# Patient Record
Sex: Female | Born: 1980
Health system: Southern US, Community
[De-identification: ages and names within clinical notes are randomized; demographics above are authoritative.]

## PROBLEM LIST (undated history)

## (undated) DIAGNOSIS — L709 Acne, unspecified: Secondary | ICD-10-CM

## (undated) DIAGNOSIS — E079 Disorder of thyroid, unspecified: Secondary | ICD-10-CM

## (undated) DIAGNOSIS — I8390 Asymptomatic varicose veins of unspecified lower extremity: Secondary | ICD-10-CM

## (undated) HISTORY — DX: Acne, unspecified: L70.9

## (undated) HISTORY — DX: Asymptomatic varicose veins of unspecified lower extremity: I83.90

## (undated) HISTORY — PX: WISDOM TOOTH EXTRACTION: SHX21

## (undated) HISTORY — DX: Disorder of thyroid, unspecified: E07.9

---
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
## 2001-03-21 ENCOUNTER — Other Ambulatory Visit: Admission: RE | Admit: 2001-03-21 | Discharge: 2001-03-21 | Payer: Self-pay | Admitting: Obstetrics and Gynecology

## 2001-04-15 ENCOUNTER — Emergency Department (HOSPITAL_COMMUNITY): Admission: EM | Admit: 2001-04-15 | Discharge: 2001-04-15 | Payer: Self-pay | Admitting: Emergency Medicine

## 2001-04-20 ENCOUNTER — Encounter: Payer: Self-pay | Admitting: Emergency Medicine

## 2001-04-20 ENCOUNTER — Observation Stay (HOSPITAL_COMMUNITY): Admission: EM | Admit: 2001-04-20 | Discharge: 2001-04-21 | Payer: Self-pay | Admitting: Emergency Medicine

## 2002-08-22 ENCOUNTER — Ambulatory Visit (HOSPITAL_BASED_OUTPATIENT_CLINIC_OR_DEPARTMENT_OTHER): Admission: RE | Admit: 2002-08-22 | Discharge: 2002-08-22 | Payer: Self-pay | Admitting: Plastic Surgery

## 2002-08-22 ENCOUNTER — Encounter (INDEPENDENT_AMBULATORY_CARE_PROVIDER_SITE_OTHER): Payer: Self-pay | Admitting: Plastic Surgery

## 2003-05-08 ENCOUNTER — Other Ambulatory Visit: Admission: RE | Admit: 2003-05-08 | Discharge: 2003-05-08 | Payer: Self-pay | Admitting: Obstetrics and Gynecology

## 2003-07-28 ENCOUNTER — Encounter: Admission: RE | Admit: 2003-07-28 | Discharge: 2003-07-28 | Payer: Self-pay | Admitting: Obstetrics and Gynecology

## 2004-03-30 ENCOUNTER — Other Ambulatory Visit: Admission: RE | Admit: 2004-03-30 | Discharge: 2004-03-30 | Payer: Self-pay | Admitting: Obstetrics and Gynecology

## 2005-09-09 ENCOUNTER — Other Ambulatory Visit: Admission: RE | Admit: 2005-09-09 | Discharge: 2005-09-09 | Payer: Self-pay | Admitting: Obstetrics and Gynecology

## 2007-10-05 ENCOUNTER — Other Ambulatory Visit: Admission: RE | Admit: 2007-10-05 | Discharge: 2007-10-05 | Payer: Self-pay | Admitting: Obstetrics and Gynecology

## 2009-01-16 ENCOUNTER — Encounter: Payer: Self-pay | Admitting: Obstetrics and Gynecology

## 2009-01-16 ENCOUNTER — Other Ambulatory Visit: Admission: RE | Admit: 2009-01-16 | Discharge: 2009-01-16 | Payer: Self-pay | Admitting: Obstetrics and Gynecology

## 2009-01-16 ENCOUNTER — Ambulatory Visit: Payer: Self-pay | Admitting: Obstetrics and Gynecology

## 2009-06-08 ENCOUNTER — Ambulatory Visit: Payer: Self-pay | Admitting: Obstetrics and Gynecology

## 2009-10-16 ENCOUNTER — Ambulatory Visit: Payer: Self-pay | Admitting: Obstetrics and Gynecology

## 2010-01-01 ENCOUNTER — Other Ambulatory Visit: Admission: RE | Admit: 2010-01-01 | Discharge: 2010-01-01 | Payer: Self-pay | Admitting: Obstetrics and Gynecology

## 2010-01-01 ENCOUNTER — Ambulatory Visit: Payer: Self-pay | Admitting: Obstetrics and Gynecology

## 2010-09-24 NOTE — Op Note (Signed)
NAME:  SCHUYLER, BEHAN A                        ACCOUNT NO.:  [id number]   MEDICAL RECORD NO.:  [id number]                   PATIENT TYPE:  AMB   LOCATION:  DSC                                  FACILITY:  [facility]   PHYSICIAN:  Alfredia Ferguson, M.D.               DATE OF BIRTH:  1980/10/21   DATE OF PROCEDURE:  08/22/2002  DATE OF DISCHARGE:                                 OPERATIVE REPORT   PREOPERATIVE DIAGNOSES:  1. Biopsy proven dysplastic nevus, left breast just above areola, 8 mm,     previously excised with close margins.  2. Biopsy proven dysplastic nevus, left medial breast, 6 mm, previously     excised with close margins.  3. Cyst left preauricular area, 5 mm.   POSTOPERATIVE DIAGNOSES:  1. Biopsy proven dysplastic nevus, left breast just above areola, 8 mm,     previously excised with close margins.  2. Biopsy proven dysplastic nevus, left medial breast, 6 mm, previously     excised with close margins.  3. Cyst left preauricular area, 5 mm.   OPERATION:  1. Excision of dysplastic nevi x 2 left breast.  2. Excision of cyst left preauricular area.   SURGEON:  Alfredia Ferguson, M.D.   ANESTHESIA:  2% Xylocaine with 1:100,000 epinephrine.   INDICATIONS FOR PROCEDURE:  This is a 30 year old woman who had two dark  pigmented nevi on her left breast.  They were biopsied by Dr. Danella Deis and  returned dysplastic nevi with severe atypia.  The recommendation is wider  excision of these lesions.  The plan is to excise these with approximately 3  mm margins.  The patient understands she will be trading what she has for  permanent and potentially unsightly scar.  Inspite of that, she wishes to  proceed with the surgery.  In addition, the patient has a slowly enlarging  cyst in front of the left ear which has got a bluish discoloration to it.  She wishes to have that removed.   DESCRIPTION OF PROCEDURE:  Skin markers were placed in elliptical fashion  around the two lesions on the  left breast and also the lesion in the left  preauricular area.  Local anesthesia was infiltrated using 2% Xylocaine with  1:100,000 epinephrine.  The left breast was prepped with Betadine and draped  in a sterile fashion.  After waiting approximately ten minutes, an  elliptical excision of the lesion just above the left areola was carried out  down to the level of the subcutaneous tissue.  3 mm margins were used around  the lesion.  The lesion was excised.  Hemostasis was accomplished using  pressure.  The wound edges were undermined for a distance of several  millimeters in all directions.  The wound was closed by approximating the  dermis using multiple interrupted 5-0 Monocryl suture.  The skin edges were  united using a running 5-0 Monocryl subcuticular.  The left medial breast  dysplastic nevus was removed in an identical fashion and also closed in an  identical fashion.  Steri-Strips were applied to the skin edges after  cleansing the skin.  The left preauricular lesion was excised in an  elliptical fashion.  A very calcified appearing cyst was removed.  Specimen  was passed off for pathology.  Wound edges were undermined for a distance of  several millimeters in all directions.  The wound was closed by  approximating the dermis using interrupted 5-0 Monocryl suture.  The skin  was united using an interrupted 6-0 Nylon suture.  The patient tolerated the  procedure well.  Light dressings were applied and the patient was discharged  to home in satisfactory condition.                                                Alfredia Ferguson, M.D.    WBB/MEDQ  D:  08/22/2002  T:  08/22/2002  Job:  [id number]   cc:   Hope M. Danella Deis, M.D.  [address]  [place]  [place] [address]  Fax: [phone]

## 2010-09-24 NOTE — H&P (Signed)
[facility]  Patient:    Traci Best, Traci Best Visit Number: [id number] MRN: [id number]          Service Type: MED Location: 3W 0372 01 Attending Physician:  Doug Sou Dictated by:   Titus Dubin. Alwyn Ren, M.D. [facility] Admit Date:  04/20/2001                           History and Physical  REASON FOR ADMISSION:  Observation status.  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  She was also admitted because of intractable sore throat in the setting of mononucleosis pharyngitis and profound anorexia.  According to the patient and her mother, she essentially has had no solid food intake for a week and has been only able to ingest Lipton soup.  Her symptoms began while at college in late November and early December.  [facility] and found to have a positive monospot.  She came home from college because of the mononucleosis and because of the weather conditions with limited power and heat to her apartment.  She was seen in the emergency room fast track for the intractable sore throat on December 8.  At that time, she was placed on cephalexin 500 mg 4 times a day and prednisolone 15 mg 3 times a day.  She returned December 10 stating that she still was not able to swallow any solid food, and the sore throat was still severe.  She was only able to eat soft food.  She denied any abdominal pain, fever, chills, or sweats.  There had been no change in the caliber of urine or stool.  It was recommended she have a fasting liver panel and CBC with differential.  This was not done until the morning of December 13.  When blood draw was attempted, she became hypotensive and had near syncope.  There is a past history of some vasovagal phenomenon with blood draws, but, because of her questionable nutritional status and  intractability of symptoms, admission was arranged.  When seen on December 10, the prednisolone had been decreased to 15 mg twice a day, and she had been placed on Ultracet as needed and Magic Mouthwash.  PAST MEDICAL HISTORY:  No surgeries or hospitalizations.  She has been seen by a psychiatrist and has been on SNSSRI.  ALLERGIES:  None.  HABITS:  She does not smoke.  REVIEW OF SYSTEMS:  She has had no purulent secretions for her head or chest.  FAMILY HISTORY:  Noncontributory.  Her brother recently had mononucleosis hepatitis but has been going to school out of state in the [place].  PHYSICAL EXAMINATION:  GENERAL:  At this time, she is profoundly weak.  She also appears somewhat chronically ill.  VITAL SIGNS:  She was afebrile.  Pulse was 70 and regular.  Regular rate and rhythm was 18 and 22.  Blood pressure was 94/60.  HEENT:  Pupils are equal, round and reactive to light.  There was no scleral icterus.  The oral mucosa was moist, but the tongue was coated.  The tonsils were minimally enlarged but had a dense greenish-grey exudate.  NECK:  She had no lymphadenopathy of head, neck, or axilla.  CHEST:  Clear.  CARDIOVASCULAR:  She exhibited an S4 without significant murmur.  ABDOMEN:  Decreased bowel sounds with some dullness  in the right upper quadrant but no definite organomegaly.  SKIN:  Warm and dry without tenting.  NEUROLOGIC:   There were no localized neurologic signs.  EXTREMITIES:  Pedal pulses were present.  She had no edema.  PLAN:  She was admitted for IV fluids.  While hospitalized, she will be seen by otolaryngology.  ADDENDUM:  Admission white count was 15,600 with 52 lymphs and 40 neutrophils. This would be compatible with the mononucleosis and the steroid therapy. Hematocrit was 40.6.  She did exhibit ketones in her urine.  SGPT was 138 with a normal of less than 40.  Potassium was 3.  Supplemental potassium will be provided  parenterally. Dictated by:   Titus Dubin. Alwyn Ren, M.D. [facility] Attending Physician:  Doug Sou DD:  04/20/01 TD:  04/20/01 Job: [id number]

## 2010-10-11 ENCOUNTER — Ambulatory Visit (INDEPENDENT_AMBULATORY_CARE_PROVIDER_SITE_OTHER): Payer: PRIVATE HEALTH INSURANCE | Admitting: Obstetrics and Gynecology

## 2010-10-11 DIAGNOSIS — N912 Amenorrhea, unspecified: Secondary | ICD-10-CM

## 2010-11-17 ENCOUNTER — Other Ambulatory Visit (INDEPENDENT_AMBULATORY_CARE_PROVIDER_SITE_OTHER): Payer: PRIVATE HEALTH INSURANCE

## 2010-11-17 DIAGNOSIS — N912 Amenorrhea, unspecified: Secondary | ICD-10-CM

## 2011-01-05 ENCOUNTER — Other Ambulatory Visit: Payer: Self-pay | Admitting: Obstetrics and Gynecology

## 2011-01-05 DIAGNOSIS — L709 Acne, unspecified: Secondary | ICD-10-CM

## 2011-01-14 ENCOUNTER — Encounter: Payer: Self-pay | Admitting: Obstetrics and Gynecology

## 2011-01-14 ENCOUNTER — Ambulatory Visit (INDEPENDENT_AMBULATORY_CARE_PROVIDER_SITE_OTHER): Payer: BC Managed Care – PPO | Admitting: Obstetrics and Gynecology

## 2011-01-14 ENCOUNTER — Other Ambulatory Visit (HOSPITAL_COMMUNITY)
Admission: RE | Admit: 2011-01-14 | Discharge: 2011-01-14 | Disposition: A | Discharge status: Home / Self Care | Payer: BC Managed Care – PPO | Source: Ambulatory Visit | Attending: Obstetrics and Gynecology | Admitting: Obstetrics and Gynecology

## 2011-01-14 VITALS — BP 116/70 | Ht 68.0 in | Wt 118.0 lb

## 2011-01-14 DIAGNOSIS — Z01419 Encounter for gynecological examination (general) (routine) without abnormal findings: Secondary | ICD-10-CM

## 2011-01-14 DIAGNOSIS — L709 Acne, unspecified: Secondary | ICD-10-CM | POA: Insufficient documentation

## 2011-01-14 NOTE — Progress Notes (Signed)
Patient came to see me today for her annual GYN exam. She is very frustrated. She can't extend the time without her period see above notes. We tried withdrawal her with Provera in July. When she did not respond we checked her prolactin FSH and TSH all of which were normal. She then had a spontaneous period in August. She is not sexually active. She is having a lot of trouble with acne which is really her biggest issue. She is also having a lot of PMS. She was previously on yaz but stopped about a year ago. She was not anxious to take birth control pills in one of her body to work on its own. She is going to see her dermatologist next week. There certainly are some family history with her parents divorce. She's also lost some more weight  although some of it was just recently.  Physical examination: HEENT within normal limits. Neck: Thyroid not large. No masses. Supraclavicular nodes: not enlarged. Breasts: Examined in both sitting midline position. No skin changes and no masses. Abdomen: Soft no guarding rebound or masses or hernia. Pelvic: External: Within normal limits. BUS: Within normal limits. Vaginal:within normal limits. Good estrogen effect. No evidence of cystocele rectocele or enterocele. Cervix: clean. Uterus: Normal size and shape. Adnexa: No masses. Rectovaginal exam: Confirmatory and negative. Extremities: Within normal limits.  Assessment: Secondary amenorrhea of hypothalamic pituitary type. PMS. Acne.  Plan: We started today on Loestrin 24 which I think will help all the above. I told her I did not think there was anyway to induce her ovarys to produce more estrogen other than fertility drugs which are not indicated. Offered to refer to reproductive endocrinologist which we wait on.she will discuss her acne with her dermatologist next week. She actually reduce her spironolactone  to from 100 mg to 50 mg because she thought her skin was getting worse on 100 mg.

## 2011-02-01 ENCOUNTER — Encounter: Payer: Self-pay | Admitting: *Deleted

## 2011-02-17 ENCOUNTER — Encounter: Payer: PRIVATE HEALTH INSURANCE | Admitting: Obstetrics and Gynecology

## 2011-02-21 ENCOUNTER — Telehealth: Payer: Self-pay | Admitting: *Deleted

## 2011-02-21 MED ORDER — SPIRONOLACTONE 50 MG PO TABS: 50.0000 mg | ORAL_TABLET | Freq: Every day | ORAL | Status: DC

## 2011-02-21 NOTE — Telephone Encounter (Signed)
yes

## 2011-02-21 NOTE — Telephone Encounter (Signed)
Pharmacy faxed a request to change Spironolactone 100mg  to 50mg .  They say that patient states she is only taking 1/2 of the 100mg  but wants to switch to just the 50mg .  Ok to change?

## 2011-02-21 NOTE — Telephone Encounter (Signed)
rx sent for below message.

## 2011-04-18 ENCOUNTER — Telehealth: Payer: Self-pay | Admitting: *Deleted

## 2011-04-18 DIAGNOSIS — N912 Amenorrhea, unspecified: Secondary | ICD-10-CM

## 2011-04-18 MED ORDER — NORETHIN ACE-ETH ESTRAD-FE 1-20 MG-MCG PO TABS: 1.0000 | ORAL_TABLET | Freq: Every day | ORAL | Status: DC

## 2011-04-18 NOTE — Telephone Encounter (Signed)
Patient called to say still having issues with not having a period.  Dr. Eda Paschal gave the patient an rx for Loestrin 24FE at last visit but never filled it. Patient is at whits end with skin breakouts and no period.  Wants to go ahead with oc's rx can we call in?

## 2011-04-18 NOTE — Telephone Encounter (Signed)
Telephone call to review requests. States no cycle after Provera withdraw. Has not been sexually active in greater than a year. Was seen in the office in September did not start on birth control pills as recommended by Dr. Eda Paschal and would like to start now. Prescription is not available at pharmacy, will call in Loestrin 1/20. Will call if no cycle, condoms encouraged if active.

## 2011-04-29 ENCOUNTER — Telehealth: Payer: Self-pay | Admitting: *Deleted

## 2011-04-29 NOTE — Telephone Encounter (Signed)
Pt called wanting she lab results in June 2012 faxed to her. Lab results faxed to pt as requested.

## 2011-05-13 ENCOUNTER — Encounter: Payer: Self-pay | Admitting: Internal Medicine

## 2011-05-13 ENCOUNTER — Telehealth: Payer: Self-pay | Admitting: *Deleted

## 2011-05-13 ENCOUNTER — Ambulatory Visit (INDEPENDENT_AMBULATORY_CARE_PROVIDER_SITE_OTHER): Payer: BC Managed Care – PPO | Admitting: Internal Medicine

## 2011-05-13 DIAGNOSIS — R5383 Other fatigue: Secondary | ICD-10-CM

## 2011-05-13 DIAGNOSIS — R5381 Other malaise: Secondary | ICD-10-CM

## 2011-05-13 DIAGNOSIS — Z0000 Encounter for general adult medical examination without abnormal findings: Secondary | ICD-10-CM

## 2011-05-13 DIAGNOSIS — R51 Headache: Secondary | ICD-10-CM

## 2011-05-13 DIAGNOSIS — R000 Tachycardia, unspecified: Secondary | ICD-10-CM

## 2011-05-13 MED ORDER — DESOGESTREL-ETHINYL ESTRADIOL 0.15-30 MG-MCG PO TABS: 1.0000 | ORAL_TABLET | Freq: Every day | ORAL | Status: DC

## 2011-05-13 MED ORDER — TRAMADOL HCL 50 MG PO TABS: 50.0000 mg | ORAL_TABLET | Freq: Four times a day (QID) | ORAL | Status: AC | PRN

## 2011-05-13 NOTE — Telephone Encounter (Signed)
Spoke with pt about the below and she would like the brand desogen. rx sent to pharmacy

## 2011-05-13 NOTE — Telephone Encounter (Signed)
Try generic Desogen

## 2011-05-13 NOTE — Telephone Encounter (Signed)
Pt calling stating she doesn't like her birth control generic Junel 1/20 she has had mood swings, along with skin problems. She would like to switch to another low dose pill. Pt had annual on 01/14/11. Please advise

## 2011-05-13 NOTE — Progress Notes (Signed)
Subjective:    Patient ID: Traci Best, female    DOB: February 14, 1981, 30 y.o.   MRN: [id number]  HPI #1 FATIGUE Onset: 10/2009 Fatigue @ rest : yes    Primarily motivational fatigue:no  Primarily physical fatigue: yes Symptoms: Fever/ chills : no  Night sweats: no                                                                                            Vision changes ( blurred/ double/ loss): ? Blurring from computer use                                                                                                 Hoarseness or swallowing dysfunction: no                                                                                         Bowel changes( constipation/ diarrhea):variable loose or constipated                                                                                Weight change:   Up 6 # with BCP Exertional chest pain: no  Dyspnea on exertion: no  Cough: no  Hemoptysis: no   Leg swelling: no  Orthopnea: no  PND: no  Melena/ rectal bleeding: no  Adenopathy: no  Severe snoring: no  Daytime sleepiness: yes  Skin / hair / nail changes: yes, hair loss; skin rash on neck since late 03/2011; acne prior to BCP Temperature intolerance( heat/ cold) : cold intlerance                                                                                Feeling depressed: no  Anhedonia: no  Altered appetite: no  Poor sleep/  Apnea : no  Abnormal bruising / bleeding or enlarged lymph nodes: easy bruising                                                       PMH/ FH of thyroid disease:no    #2 HEADACHE : Onset:past 12 months  Location: variable  Quality: dull Frequency: 4-5 X/ week Precipitating factors:no triggers Prodrome/ Aura: no Prior treatment:BCpowder , NSAIDS  Associated Symptoms Nausea/vomiting: no  Photophobia/phonophobia: to light  Tearing of eyes: no  Sinus pain/pressure: no  Family hx/PMH of migraine: no  Relation to menstrual cycle: yes, worse with  menses . Menses have been irregular; she did not have a period for 7 months. It is one of the reasons the birth control pills were prescribed.  Red Flags Fever: no  Neck pain/stiffness: no  Vision/speech/swallow/hearing difficulty: no  Focal weakness/numbness: no  Altered mental status: yes, "very foggy"  New type of headache: no , but more chronic  Lab studies 04/11/11 were reviewed. CBC and differential, sedimentation rate, glucose, and chemistries were normal. Ferritin and testosterone were also normal. TSH, FSH, and prolactin were normal in July 2012.  DHEA was 35 with normals greater than 40.      Review of Systems     Objective:   Physical Exam  Gen. appearance: thin but well-nourished, in no distress. Head: Normocephalic. Hair is very fine. No significant scalp lesions Eyes: Extraocular motion intact, field of vision normal, vision grossly intact, no nystagmus ENT: Canals clear, tympanic membranes normal, tuning fork exam normal, hearing grossly normal Neck: Normal range of motion, no masses, normal thyroid Cardiovascular: Rate and rhythm normal; no murmurs, gallops . Mitral click suggestive apex Muscle skeletal: Range of motion, tone, &  strength normal Neuro:no cranial nerve deficit, deep tendon  reflexes normal, gait normal. Romberg and finger to nose testing is normal Lymph: No cervical or axillary LA Skin: Warm and dry without suspicious lesions. There are slightly rough erythematous patches over the  upper lateral neck bilaterally. There is a small subcutaneous granuloma at the angle of the left mandible Psych: no anxiety or mood change. Normally interactive and cooperative.         Assessment & Plan:  #1 protracted fatigue  #2 hair loss cervical evaluation revealed low DHEA level  #3 headaches, variable  #4 subcutaneous, versus epidermoid inclusion cyst. If this changes in character ( color, size, or tenderness ) it should be addressed.  #5 resting  tachycardia. EKG is normal with no ischemic changes  #6 history suggest possible irritable bowel syndrome. Probiotic trial would be recommended for loose stools.  Plan: Thyroid function test should be repeated as the results are almost 6 months old. Because of the DHEA additional endocrinologic studies may be indicated. Headache diary will be monitored. She'll be given tramadol to take for the headaches.

## 2011-05-13 NOTE — Patient Instructions (Addendum)
Please keep a diary of your headaches . Document  each occurrence on the calendar with notation of : #1 any prodrome ( any non headache symptom such as marked fatigue,visual changes, ,etc ) which precedes actual headache ; #2) severity on 1-10 scale; #3) any triggers ( food/ drink,enviromenntal or weather changes ,physical or emotional stress) in 8-12 hour period prior to the headache; & #4) response to any medications or other intervention. Please review "Headache" @ [facility] for additional information.    Please take the probiotic , Align, every day until the bowels are normal. This will replace the normal bacteria which  are necessary for formation of normal stool and processing of food.  Use Eucerin or another moisturizing agent  twice a day  for the drying. If there is itching; the moisturizing agent can be mixed one part to one part of Cort Aid  and applied twice a day as needed.

## 2011-05-19 ENCOUNTER — Telehealth: Payer: Self-pay

## 2011-05-19 DIAGNOSIS — R5381 Other malaise: Secondary | ICD-10-CM

## 2011-05-19 NOTE — Telephone Encounter (Signed)
Future orders placed.  Dr.Hopper please advise if additional test can be added.

## 2011-05-19 NOTE — Telephone Encounter (Signed)
I e-mailed her that I did not recommend a CA 125. This test is very poor screening test and too often results in unnecessary surgery with associated risks. If her gynecologist feels it  is indicated, he can order it.

## 2011-05-20 ENCOUNTER — Other Ambulatory Visit (INDEPENDENT_AMBULATORY_CARE_PROVIDER_SITE_OTHER): Payer: BC Managed Care – PPO

## 2011-05-20 DIAGNOSIS — L659 Nonscarring hair loss, unspecified: Secondary | ICD-10-CM

## 2011-05-20 DIAGNOSIS — R5381 Other malaise: Secondary | ICD-10-CM

## 2011-05-20 DIAGNOSIS — R5383 Other fatigue: Secondary | ICD-10-CM

## 2011-05-20 LAB — T4, FREE: Free T4: 0.8 ng/dL (ref 0.60–1.60)

## 2011-05-20 LAB — TSH: TSH: 2.88 u[IU]/mL (ref 0.35–5.50)

## 2011-05-26 ENCOUNTER — Telehealth: Payer: Self-pay

## 2011-05-26 NOTE — Telephone Encounter (Signed)
Patient emailed Dr.Hopper about lab results  I called patient and informed her labs mailed and gave Dr.Hopper's response: All labs are excellent.As I discussed with your mother; options are to see me to discuss a trial of medicine for chronic fatigue or consultation wirh fibromyalgia specialist to rule out that condition. Please inform me as to your preference.Hopp  Patient stated she will wait until she receives mailed copy and then decide if she will f/u here or see specialist

## 2011-06-02 ENCOUNTER — Institutional Professional Consult (permissible substitution): Payer: BC Managed Care – PPO | Admitting: Obstetrics and Gynecology

## 2011-06-17 ENCOUNTER — Other Ambulatory Visit: Payer: Self-pay | Admitting: Internal Medicine

## 2011-06-17 DIAGNOSIS — R768 Other specified abnormal immunological findings in serum: Secondary | ICD-10-CM

## 2011-07-08 DIAGNOSIS — E063 Autoimmune thyroiditis: Secondary | ICD-10-CM | POA: Insufficient documentation

## 2011-09-05 ENCOUNTER — Ambulatory Visit (INDEPENDENT_AMBULATORY_CARE_PROVIDER_SITE_OTHER): Payer: BC Managed Care – PPO | Admitting: Obstetrics and Gynecology

## 2011-09-05 DIAGNOSIS — N63 Unspecified lump in unspecified breast: Secondary | ICD-10-CM

## 2011-09-05 NOTE — Progress Notes (Signed)
Patient came to see me today with a short history of feeling a lump in her right breast. It is associated with a white area on her nipple. She took birth control pills for several months and was not happy on them. She stopped them and she is now having spontaneous cycles. Her PMS is much better.  Exam: Traci Best present. Both breasts were carefully examined the sitting and lying position. In her right breast at 9:00 she has significant lumpiness  without a dominant lesion. There is a whitehead on 1 of her nipple ducts consistent with a montgomery gland. Her left breast exam is completely normal.  Assessment: Possible breast lesion of right breast.  Plan: Diagnostic mammogram and ultrasound.

## 2011-09-06 ENCOUNTER — Other Ambulatory Visit: Payer: Self-pay | Admitting: *Deleted

## 2011-09-06 ENCOUNTER — Telehealth: Payer: Self-pay | Admitting: *Deleted

## 2011-09-06 DIAGNOSIS — N63 Unspecified lump in unspecified breast: Secondary | ICD-10-CM

## 2011-09-06 NOTE — Telephone Encounter (Signed)
Message copied by Mckinley Jewel, Salvatore Shear L on Tue Sep 06, 2011 11:17 AM ------      Message from: Trellis Paganini      Created: Mon Sep 05, 2011  1:11 PM       Schedule patient for next Thursday or Friday afternoon( not this week) at the breast center for ultrasound, possible diagnostic mammogram of right breast due to lump in right breast at 9:00 associated with small nipple lesion.

## 2011-09-06 NOTE — Telephone Encounter (Signed)
Lm for patient to call.  appt set up at the [facility] on 09/17/11 @ 2pm.

## 2011-09-07 NOTE — Telephone Encounter (Signed)
Patient informed. 

## 2011-09-15 ENCOUNTER — Ambulatory Visit
Admission: RE | Admit: 2011-09-15 | Discharge: 2011-09-15 | Disposition: A | Discharge status: Home / Self Care | Payer: BC Managed Care – PPO | Source: Ambulatory Visit | Attending: Obstetrics and Gynecology | Admitting: Obstetrics and Gynecology

## 2011-09-15 DIAGNOSIS — N63 Unspecified lump in unspecified breast: Secondary | ICD-10-CM

## 2011-12-26 ENCOUNTER — Telehealth: Payer: Self-pay | Admitting: *Deleted

## 2011-12-26 NOTE — Telephone Encounter (Signed)
Okay to refer Dr. Leslie Dales for hashimoto's disease.

## 2011-12-26 NOTE — Telephone Encounter (Signed)
(  Pt aware you are out of the office) pt called requesting referral for endocrinologist Dr. Casimiro Needle [place]. I asked pt what MD  is following her Thyroid and was told that Dr.Hopper, I asked pt why isn't Dr.Hopper referring  pt to endocrinologist and was told she would prefer you to do this. Dx: hashimoto's disease. Please advise

## 2011-12-27 NOTE — Telephone Encounter (Signed)
Pt records faxed to Dr. Leslie Dales office [phone] (office) [phone]. They will contact pt and schedule appointment and contact us with time and date.

## 2012-01-02 NOTE — Telephone Encounter (Signed)
Appt. 01/17/12 @ 2:30 pm with MD.

## 2012-01-18 ENCOUNTER — Encounter: Payer: Self-pay | Admitting: Obstetrics and Gynecology

## 2012-01-18 ENCOUNTER — Ambulatory Visit (INDEPENDENT_AMBULATORY_CARE_PROVIDER_SITE_OTHER): Payer: BC Managed Care – PPO | Admitting: Obstetrics and Gynecology

## 2012-01-18 VITALS — BP 110/70 | Ht 68.25 in | Wt 122.0 lb

## 2012-01-18 DIAGNOSIS — Z01419 Encounter for gynecological examination (general) (routine) without abnormal findings: Secondary | ICD-10-CM

## 2012-01-18 DIAGNOSIS — E079 Disorder of thyroid, unspecified: Secondary | ICD-10-CM | POA: Insufficient documentation

## 2012-01-18 NOTE — Progress Notes (Signed)
Patient came to see me today for her annual GYN exam. Her menstrual cycles are regular. She had a normal Pap smear last year. She's never had abnormal Pap smears.she has not been sexually active since I last saw her. She has been bothered by fatigue, hair loss, acne and rashes as well as some depression and has been diagnosed with Hashimoto's disease with elevated thyroid antibodies. She just started T4 for treatment. We have been giving her spironolactone for acne. She is only taking 50 mg daily and knows she can increase it slowly up to 200 but wants to stay at 50 until she sees how she is doing with the T4. She is having no abnormal bleeding. She is having no pelvic pain.  Physical examination: Kennon Portela present. HEENT within normal limits. Neck: Thyroid not large. No masses. Supraclavicular nodes: not enlarged. Breasts: Examined in both sitting and lying  position. No skin changes and no masses. Abdomen: Soft no guarding rebound or masses or hernia. Pelvic: External: Within normal limits. BUS: Within normal limits. Vaginal:within normal limits. Good estrogen effect. No evidence of cystocele rectocele or enterocele. Cervix: clean. Uterus: Normal size and shape. Adnexa: No masses. Rectovaginal exam: Confirmatory and negative. Extremities: Within normal limits.  Assessment: Normal GYN exam. Hashimoto's disease.  Plan: Continue spironolactone 50 mg daily.The new Pap smear guidelines were discussed with the patient. No Pap Done.

## 2012-01-18 NOTE — Patient Instructions (Signed)
Call us with the name of the T4 product you are taking.

## 2012-01-19 LAB — URINALYSIS W MICROSCOPIC + REFLEX CULTURE
Bacteria, UA: NONE SEEN
Casts: NONE SEEN
Glucose, UA: NEGATIVE mg/dL
Hgb urine dipstick: NEGATIVE
Ketones, ur: NEGATIVE mg/dL
Leukocytes, UA: NEGATIVE
Protein, ur: NEGATIVE mg/dL
pH: 7.5 (ref 5.0–8.0)

## 2012-02-21 ENCOUNTER — Other Ambulatory Visit: Payer: Self-pay | Admitting: Obstetrics and Gynecology

## 2012-10-11 ENCOUNTER — Telehealth: Payer: Self-pay | Admitting: Internal Medicine

## 2012-10-11 NOTE — Telephone Encounter (Signed)
Because of her unique situation we want to avoid immunizations that are not totally necessary. Dr. Alto Denver is the expert in travel medicine and would optimize treatment and prophylaxis for the [place] trip  I'll be traveling to [place] in August and will be seeing Dr.Hunt  as well

## 2012-10-11 NOTE — Telephone Encounter (Signed)
Discuss with patient and gave contact info for travel clinic

## 2012-10-11 NOTE — Telephone Encounter (Signed)
Patient states that she is going to [place] and would like to know if she needs any vaccinations? I told her earlier to call the Health Department, but someone there directed her back to [place].

## 2012-10-11 NOTE — Telephone Encounter (Signed)
Pt indicated that she look up info on CDC and it is recommended for her travel to [place] that she get Typhoid, and  Hep A. Pt would like for Hopp to review her current medical history and give his recommendation as well if Pt should get these vaccine. Pt would also like to know if Alfonse Flavors is willing to Rx med for altitude when she travels. Last OV 05-13-11.Please advise

## 2012-12-10 ENCOUNTER — Ambulatory Visit (INDEPENDENT_AMBULATORY_CARE_PROVIDER_SITE_OTHER): Payer: BC Managed Care – PPO | Admitting: Internal Medicine

## 2012-12-10 ENCOUNTER — Encounter: Payer: Self-pay | Admitting: Internal Medicine

## 2012-12-10 VITALS — BP 100/66 | HR 62 | Temp 98.7°F | Wt 129.0 lb

## 2012-12-10 DIAGNOSIS — K589 Irritable bowel syndrome without diarrhea: Secondary | ICD-10-CM

## 2012-12-10 MED ORDER — HYOSCYAMINE SULFATE 0.125 MG SL SUBL: SUBLINGUAL_TABLET | SUBLINGUAL | Status: DC

## 2012-12-10 NOTE — Progress Notes (Signed)
  Subjective:    Patient ID: Traci Best, female    DOB: 01-15-1981, 32 y.o.   MRN: [id number]  HPI  On an air flight back from [place] 7/17 she began to have her present GI symptoms. She was improved 7/11-7/17/14. Clear liquids did help.  The symptoms are described as hyperactivity with increased bowel sounds and being "gassy and irregular". The stools are described as intermittently loose.  She questions possible relationship to ingesting nuts and and food containing seeds. She also questions a possible role of job related stresses.  Her father has a history of diverticulosis.    Review of Systems  She denies fever, chills, sweats, abdominal pain, or unexplained weight loss. She is not having frank diarrhea.  She denies Coke-colored urine or clay colored stools. She's had no melena or rectal bleeding.     Objective:   Physical Exam General appearance : thin but well nourished; w/o distress.  Eyes: No conjunctival inflammation or scleral icterus is present.  Oral exam: Dental hygiene is good; lips and gums are healthy appearing.There is no oropharyngeal erythema or exudate noted.   Heart:  Normal rate and regular rhythm. S1 and S2 normal without gallop, murmur, click, rub or other extra sounds     Lungs:Chest clear to auscultation; no wheezes, rhonchi,rales ,or rubs present.No increased work of breathing.   Abdomen: bowel sounds hyperactive, soft and non-tender without masses, organomegaly or hernias noted.  No guarding or rebound   Skin:Warm & dry.  Intact without suspicious lesions or rashes ; no jaundice or tenting  Lymphatic: No lymphadenopathy is noted about the head, neck, or axilla.             Assessment & Plan:  #1IBS See orders

## 2012-12-10 NOTE — Patient Instructions (Addendum)
Please take the probiotic ,Florastor, every day until the bowels are normal. This will replace the normal bacteria which  are necessary for formation of normal stool and processing of food. Reflux of gastric acid may be asymptomatic as this may occur mainly during sleep.The triggers for reflux  include stress; the "aspirin family" ; alcohol; peppermint; and caffeine (coffee, tea, cola, and chocolate). The aspirin family would include aspirin and the nonsteroidal agents such as ibuprofen &  Naproxen. Tylenol would not cause reflux. If having symptoms ; food & drink should be avoided for @ least 2 hours before going to bed.  Zantac 75 mg 1-2 every 12 hours as needed.

## 2013-01-21 ENCOUNTER — Encounter: Payer: Self-pay | Admitting: Gynecology

## 2013-01-21 ENCOUNTER — Ambulatory Visit (INDEPENDENT_AMBULATORY_CARE_PROVIDER_SITE_OTHER): Payer: BC Managed Care – PPO | Admitting: Gynecology

## 2013-01-21 VITALS — BP 110/64 | Ht 68.0 in | Wt 126.0 lb

## 2013-01-21 DIAGNOSIS — Z01419 Encounter for gynecological examination (general) (routine) without abnormal findings: Secondary | ICD-10-CM

## 2013-01-21 DIAGNOSIS — Z13220 Encounter for screening for lipoid disorders: Secondary | ICD-10-CM

## 2013-01-21 LAB — CBC WITH DIFFERENTIAL/PLATELET
Basophils Absolute: 0 10*3/uL (ref 0.0–0.1)
Eosinophils Relative: 1 % (ref 0–5)
Lymphocytes Relative: 42 % (ref 12–46)
Lymphs Abs: 2.2 10*3/uL (ref 0.7–4.0)
MCV: 88.2 fL (ref 78.0–100.0)
Neutro Abs: 2.4 10*3/uL (ref 1.7–7.7)
Neutrophils Relative %: 47 % (ref 43–77)
Platelets: 277 10*3/uL (ref 150–400)
RBC: 4.67 MIL/uL (ref 3.87–5.11)
RDW: 13.5 % (ref 11.5–15.5)
WBC: 5.2 10*3/uL (ref 4.0–10.5)

## 2013-01-21 LAB — LIPID PANEL
Cholesterol: 175 mg/dL (ref 0–200)
Total CHOL/HDL Ratio: 2.5 Ratio

## 2013-01-21 LAB — COMPREHENSIVE METABOLIC PANEL
ALT: 21 U/L (ref 0–35)
AST: 16 U/L (ref 0–37)
CO2: 26 mEq/L (ref 19–32)
Calcium: 10.2 mg/dL (ref 8.4–10.5)
Chloride: 102 mEq/L (ref 96–112)
Creat: 0.77 mg/dL (ref 0.50–1.10)
Potassium: 4.4 mEq/L (ref 3.5–5.3)
Sodium: 135 mEq/L (ref 135–145)
Total Protein: 7.7 g/dL (ref 6.0–8.3)

## 2013-01-21 MED ORDER — SPIRONOLACTONE 50 MG PO TABS: ORAL_TABLET | ORAL | Status: DC

## 2013-01-21 NOTE — Progress Notes (Signed)
Traci Best 07-17-1980 [id number]        32 y.o.  G0P0 for annual exam.  Former patient of Dr. Eda Paschal. Doing well without complaints.  Past medical history,surgical history, medications, allergies, family history and social history were all reviewed and documented in the EPIC chart.  ROS:  Performed and pertinent positives and negatives are included in the history, assessment and plan .  Exam: Kim assistant Filed Vitals:   01/21/13 1027  BP: 110/64  Height: 5\' 8"  (1.727 m)  Weight: 126 lb (57.153 kg)   General appearance  Normal Skin grossly normal Head/Neck normal with no cervical or supraclavicular adenopathy thyroid normal Lungs  clear Cardiac RR, without RMG Abdominal  soft, nontender, without masses, organomegaly or hernia Breasts  examined lying and sitting without masses, retractions, discharge or axillary adenopathy. Pelvic  Ext/BUS/vagina  normal  Cervix  normal  Uterus  anteverted, normal size, shape and contour, midline and mobile nontender   Adnexa  Without masses or tenderness    Anus and perineum  normal      Assessment/Plan:  32 y.o. G0P0 female for annual exam, regular menses, not sexually active.   1. Contraception. Patient previously had been on oral contraceptives but stopped these because she did not like the way she felt. She is not sexual active and this is not an issue from a contraceptive standpoint. She preferred to stay off of these at present. Having regular monthly menses. We'll continue to monitor. 2. Complexion. Patient has been on spironolactone 50 mg for acne per Dr. Eda Paschal. She wants to stop this because she feels it was more thyroid dysfunction related and since having her thyroiditis treated she feels it has gotten better. She does want to continue it for the next 6 months while she is in [place] because she is afraid to stop it and I refilled her time 6 months. She will then stop after that and we'll go from there. We'll check  electrolytes today. 3. Breast health. SBE monthly reviewed. Did have a mammogram at age 45 which was normal. Discussed screening recommendations between 59 and 40. 4. Pap smear 2012 normal. No Pap smear done today. No history of abnormal Pap smears previously. Plan repeat next year 3 year interval. 5. Health maintenance. Baseline CBC comprehensive metabolic panel lipid profile urinalysis ordered. She will continue to followup with her endocrinologist in reference to her thyroid. Followup here in one year, sooner if any issues.   Note: This document was prepared with digital dictation and possible smart phrase technology. Any transcriptional errors that result from this process are unintentional.   [name] MD, 11:00 AM 01/21/2013

## 2013-01-21 NOTE — Patient Instructions (Signed)
Stop spironolactone when you return from [place] as we discussed. Call me if you have any issues with this. Followup in one year for annual exam and Pap smear.

## 2013-01-22 LAB — URINALYSIS W MICROSCOPIC + REFLEX CULTURE
Crystals: NONE SEEN
Glucose, UA: NEGATIVE mg/dL
Leukocytes, UA: NEGATIVE
Protein, ur: NEGATIVE mg/dL
Specific Gravity, Urine: 1.008 (ref 1.005–1.030)
Squamous Epithelial / LPF: NONE SEEN
Urobilinogen, UA: 0.2 mg/dL (ref 0.0–1.0)

## 2013-03-14 ENCOUNTER — Other Ambulatory Visit: Payer: Self-pay

## 2013-08-05 ENCOUNTER — Other Ambulatory Visit: Payer: Self-pay | Admitting: Gynecology

## 2013-08-16 ENCOUNTER — Telehealth: Payer: Self-pay | Admitting: *Deleted

## 2013-08-16 NOTE — Telephone Encounter (Signed)
(  pt aware you are out of the office) Pt Rx for spironolactone 50 mg for acne which was denied, I read the note 01/21/13 that said "Patient has been on spironolactone 50 mg for acne per Dr. Eda PaschalGottsegen. She wants to stop this because she feels it was more thyroid dysfunction related and since having her thyroiditis treated she feels it has gotten better. She does want to continue it for the next 6 months while [place] because she is afraid to stop it and I refilled her time 6 months. She will then stop after that and we'll go from there. " pt asked if you could refill until her annual which will not be scheduled until Oct[place]. Please advise

## 2013-08-19 MED ORDER — SPIRONOLACTONE 50 MG PO TABS: ORAL_TABLET | ORAL | Status: DC

## 2013-08-19 NOTE — Telephone Encounter (Signed)
Okay to refill through annual 

## 2013-08-19 NOTE — Telephone Encounter (Signed)
Pt informed, rx sent 

## 2014-01-30 ENCOUNTER — Other Ambulatory Visit (HOSPITAL_COMMUNITY)
Admission: RE | Admit: 2014-01-30 | Discharge: 2014-01-30 | Disposition: A | Discharge status: Home / Self Care | Payer: BC Managed Care – PPO | Source: Ambulatory Visit | Attending: Gynecology | Admitting: Gynecology

## 2014-01-30 ENCOUNTER — Encounter: Payer: Self-pay | Admitting: Gynecology

## 2014-01-30 ENCOUNTER — Ambulatory Visit (INDEPENDENT_AMBULATORY_CARE_PROVIDER_SITE_OTHER): Payer: BC Managed Care – PPO | Admitting: Gynecology

## 2014-01-30 VITALS — BP 112/64 | Ht 69.0 in | Wt 138.0 lb

## 2014-01-30 DIAGNOSIS — Z01419 Encounter for gynecological examination (general) (routine) without abnormal findings: Secondary | ICD-10-CM | POA: Insufficient documentation

## 2014-01-30 DIAGNOSIS — Z1151 Encounter for screening for human papillomavirus (HPV): Secondary | ICD-10-CM | POA: Insufficient documentation

## 2014-01-30 DIAGNOSIS — Z113 Encounter for screening for infections with a predominantly sexual mode of transmission: Secondary | ICD-10-CM

## 2014-01-30 LAB — CBC WITH DIFFERENTIAL/PLATELET
BASOS ABS: 0 10*3/uL (ref 0.0–0.1)
BASOS PCT: 0 % (ref 0–1)
Eosinophils Absolute: 0.1 10*3/uL (ref 0.0–0.7)
Eosinophils Relative: 1 % (ref 0–5)
HEMATOCRIT: 42.2 % (ref 36.0–46.0)
Hemoglobin: 14.3 g/dL (ref 12.0–15.0)
LYMPHS PCT: 25 % (ref 12–46)
Lymphs Abs: 2 10*3/uL (ref 0.7–4.0)
MCH: 30.5 pg (ref 26.0–34.0)
MCHC: 33.9 g/dL (ref 30.0–36.0)
MCV: 90 fL (ref 78.0–100.0)
Monocytes Absolute: 0.5 10*3/uL (ref 0.1–1.0)
Monocytes Relative: 6 % (ref 3–12)
NEUTROS ABS: 5.3 10*3/uL (ref 1.7–7.7)
NEUTROS PCT: 68 % (ref 43–77)
PLATELETS: 293 10*3/uL (ref 150–400)
RBC: 4.69 MIL/uL (ref 3.87–5.11)
RDW: 13.4 % (ref 11.5–15.5)
WBC: 7.8 10*3/uL (ref 4.0–10.5)

## 2014-01-30 LAB — COMPREHENSIVE METABOLIC PANEL
ALBUMIN: 4.7 g/dL (ref 3.5–5.2)
ALK PHOS: 58 U/L (ref 39–117)
ALT: 17 U/L (ref 0–35)
AST: 16 U/L (ref 0–37)
BUN: 12 mg/dL (ref 6–23)
CALCIUM: 9.9 mg/dL (ref 8.4–10.5)
CHLORIDE: 101 meq/L (ref 96–112)
CO2: 25 mEq/L (ref 19–32)
Creat: 0.62 mg/dL (ref 0.50–1.10)
Glucose, Bld: 76 mg/dL (ref 70–99)
POTASSIUM: 4.5 meq/L (ref 3.5–5.3)
SODIUM: 136 meq/L (ref 135–145)
Total Bilirubin: 0.9 mg/dL (ref 0.2–1.2)
Total Protein: 7.5 g/dL (ref 6.0–8.3)

## 2014-01-30 LAB — LIPID PANEL
Cholesterol: 183 mg/dL (ref 0–200)
HDL: 79 mg/dL (ref >39)
LDL CALC: 95 mg/dL (ref 0–99)
Total CHOL/HDL Ratio: 2.3 Ratio
Triglycerides: 43 mg/dL (ref <150)
VLDL: 9 mg/dL (ref 0–40)

## 2014-01-30 LAB — HIV ANTIBODY (ROUTINE TESTING W REFLEX): HIV: NONREACTIVE

## 2014-01-30 LAB — RPR

## 2014-01-30 NOTE — Patient Instructions (Signed)
You may obtain a copy of any labs that were done today by logging onto MyChart as outlined in the instructions provided with your AVS (after visit summary). The office will not call with normal lab results but certainly if there are any significant abnormalities then we will contact you.   Health Maintenance, Female A healthy lifestyle and preventative care can promote health and wellness.  Maintain regular health, dental, and eye exams.  Eat a healthy diet. Foods like vegetables, fruits, whole grains, low-fat dairy products, and lean protein foods contain the nutrients you need without too many calories. Decrease your intake of foods high in solid fats, added sugars, and salt. Get information about a proper diet from your caregiver, if necessary.  Regular physical exercise is one of the most important things you can do for your health. Most adults should get at least 150 minutes of moderate-intensity exercise (any activity that increases your heart rate and causes you to sweat) each week. In addition, most adults need muscle-strengthening exercises on 2 or more days a week.   Maintain a healthy weight. The body mass index (BMI) is a screening tool to identify possible weight problems. It provides an estimate of body fat based on height and weight. Your caregiver can help determine your BMI, and can help you achieve or maintain a healthy weight. For adults 20 years and older:  A BMI below 18.5 is considered underweight.  A BMI of 18.5 to 24.9 is normal.  A BMI of 25 to 29.9 is considered overweight.  A BMI of 30 and above is considered obese.  Maintain normal blood lipids and cholesterol by exercising and minimizing your intake of saturated fat. Eat a balanced diet with plenty of fruits and vegetables. Blood tests for lipids and cholesterol should begin at age 61 and be repeated every 5 years. If your lipid or cholesterol levels are high, you are over 50, or you are a high risk for heart  disease, you may need your cholesterol levels checked more frequently.Ongoing high lipid and cholesterol levels should be treated with medicines if diet and exercise are not effective.  If you smoke, find out from your caregiver how to quit. If you do not use tobacco, do not start.  Lung cancer screening is recommended for adults aged 33 80 years who are at high risk for developing lung cancer because of a history of smoking. Yearly low-dose computed tomography (CT) is recommended for people who have at least a 30-pack-year history of smoking and are a current smoker or have quit within the past 15 years. A pack year of smoking is smoking an average of 1 pack of cigarettes a day for 1 year (for example: 1 pack a day for 30 years or 2 packs a day for 15 years). Yearly screening should continue until the smoker has stopped smoking for at least 15 years. Yearly screening should also be stopped for people who develop a health problem that would prevent them from having lung cancer treatment.  If you are pregnant, do not drink alcohol. If you are breastfeeding, be very cautious about drinking alcohol. If you are not pregnant and choose to drink alcohol, do not exceed 1 drink per day. One drink is considered to be 12 ounces (355 mL) of beer, 5 ounces (148 mL) of wine, or 1.5 ounces (44 mL) of liquor.  Avoid use of street drugs. Do not share needles with anyone. Ask for help if you need support or instructions about stopping  the use of drugs.  High blood pressure causes heart disease and increases the risk of stroke. Blood pressure should be checked at least every 1 to 2 years. Ongoing high blood pressure should be treated with medicines, if weight loss and exercise are not effective.  If you are 59 to 33 years old, ask your caregiver if you should take aspirin to prevent strokes.  Diabetes screening involves taking a blood sample to check your fasting blood sugar level. This should be done once every 3  years, after age 91, if you are within normal weight and without risk factors for diabetes. Testing should be considered at a younger age or be carried out more frequently if you are overweight and have at least 1 risk factor for diabetes.  Breast cancer screening is essential preventative care for women. You should practice "breast self-awareness." This means understanding the normal appearance and feel of your breasts and may include breast self-examination. Any changes detected, no matter how small, should be reported to a caregiver. Women in their 66s and 30s should have a clinical breast exam (CBE) by a caregiver as part of a regular health exam every 1 to 3 years. After age 101, women should have a CBE every year. Starting at age 100, women should consider having a mammogram (breast X-ray) every year. Women who have a family history of breast cancer should talk to their caregiver about genetic screening. Women at a high risk of breast cancer should talk to their caregiver about having an MRI and a mammogram every year.  Breast cancer gene (BRCA)-related cancer risk assessment is recommended for women who have family members with BRCA-related cancers. BRCA-related cancers include breast, ovarian, tubal, and peritoneal cancers. Having family members with these cancers may be associated with an increased risk for harmful changes (mutations) in the breast cancer genes BRCA1 and BRCA2. Results of the assessment will determine the need for genetic counseling and BRCA1 and BRCA2 testing.  The Pap test is a screening test for cervical cancer. Women should have a Pap test starting at age 57. Between ages 25 and 35, Pap tests should be repeated every 2 years. Beginning at age 37, you should have a Pap test every 3 years as long as the past 3 Pap tests have been normal. If you had a hysterectomy for a problem that was not cancer or a condition that could lead to cancer, then you no longer need Pap tests. If you are  between ages 50 and 76, and you have had normal Pap tests going back 10 years, you no longer need Pap tests. If you have had past treatment for cervical cancer or a condition that could lead to cancer, you need Pap tests and screening for cancer for at least 20 years after your treatment. If Pap tests have been discontinued, risk factors (such as a new sexual partner) need to be reassessed to determine if screening should be resumed. Some women have medical problems that increase the chance of getting cervical cancer. In these cases, your caregiver may recommend more frequent screening and Pap tests.  The human papillomavirus (HPV) test is an additional test that may be used for cervical cancer screening. The HPV test looks for the virus that can cause the cell changes on the cervix. The cells collected during the Pap test can be tested for HPV. The HPV test could be used to screen women aged 44 years and older, and should be used in women of any age  who have unclear Pap test results. After the age of 55, women should have HPV testing at the same frequency as a Pap test.  Colorectal cancer can be detected and often prevented. Most routine colorectal cancer screening begins at the age of 44 and continues through age 20. However, your caregiver may recommend screening at an earlier age if you have risk factors for colon cancer. On a yearly basis, your caregiver may provide home test kits to check for hidden blood in the stool. Use of a small camera at the end of a tube, to directly examine the colon (sigmoidoscopy or colonoscopy), can detect the earliest forms of colorectal cancer. Talk to your caregiver about this at age 86, when routine screening begins. Direct examination of the colon should be repeated every 5 to 10 years through age 13, unless early forms of pre-cancerous polyps or small growths are found.  Hepatitis C blood testing is recommended for all people born from [birth year] through 1965 and any  individual with known risks for hepatitis C.  Practice safe sex. Use condoms and avoid high-risk sexual practices to reduce the spread of sexually transmitted infections (STIs). Sexually active women aged 36 and younger should be checked for Chlamydia, which is a common sexually transmitted infection. Older women with new or multiple partners should also be tested for Chlamydia. Testing for other STIs is recommended if you are sexually active and at increased risk.  Osteoporosis is a disease in which the bones lose minerals and strength with aging. This can result in serious bone fractures. The risk of osteoporosis can be identified using a bone density scan. Women ages 20 and over and women at risk for fractures or osteoporosis should discuss screening with their caregivers. Ask your caregiver whether you should be taking a calcium supplement or vitamin D to reduce the rate of osteoporosis.  Menopause can be associated with physical symptoms and risks. Hormone replacement therapy is available to decrease symptoms and risks. You should talk to your caregiver about whether hormone replacement therapy is right for you.  Use sunscreen. Apply sunscreen liberally and repeatedly throughout the day. You should seek shade when your shadow is shorter than you. Protect yourself by wearing long sleeves, pants, a wide-brimmed hat, and sunglasses year round, whenever you are outdoors.  Notify your caregiver of new moles or changes in moles, especially if there is a change in shape or color. Also notify your caregiver if a mole is larger than the size of a pencil eraser.  Stay current with your immunizations. Document Released: 11/08/2010 Document Revised: 08/20/2012 Document Reviewed: 11/08/2010 [facility] Patient Information 2014 [employer].

## 2014-01-30 NOTE — Addendum Note (Signed)
Addended by: Dayna Barker on: 01/30/2014 11:04 AM   Modules accepted: Orders

## 2014-01-30 NOTE — Progress Notes (Signed)
Traci Best June 04, 1980 [id number]        33 y.o.  G0P0 for annual exam.  Several issues noted below.  Past medical history,surgical history, problem list, medications, allergies, family history and social history were all reviewed and documented as reviewed in the EPIC chart.  ROS:  12 system ROS performed with pertinent positives and negatives included in the history, assessment and plan.   Additional significant findings :  none   Exam: Traci Best Vitals:   01/30/14 1025  BP: 112/64  Height:  (1.753 m)  Weight: 138 lb (62.596 kg)   General appearance:  Normal affect, orientation and appearance. Skin: Grossly normal HEENT: Without gross lesions.  No cervical or supraclavicular adenopathy. Thyroid normal.  Lungs:  Clear without wheezing, rales or rhonchi Cardiac: RR, without RMG Abdominal:  Soft, nontender, without masses, guarding, rebound, organomegaly or hernia Breasts:  Examined lying and sitting without masses, retractions, discharge or axillary adenopathy. Pelvic:  Ext/BUS/vagina normal  Cervix normal. Pap/HPV. GC/Chlamydia  Uterus anteverted, normal size, shape and contour, midline and mobile nontender   Adnexa  Without masses or tenderness    Anus and perineum  Normal   Rectovaginal  Normal sphincter tone without palpated masses or tenderness.    Assessment/Plan:  33 y.o. G0P0 female for annual exam with regular menses, not sexually active.   1. Contraceptive management. Patient is not sexually active and does not want to start any contraception at this time. She will call if this becomes an issue. 2. STD screening. Broke up with her boyfriend earlier this year and requests STD screening. No known exposure. GC/Chlamydia, hepatitis B/C., RPR, HIV ordered. 3. Spironolactone. Patient had been on spironolactone for acne suppression.  Had previously been on oral contraceptives but did not like the way it made her feel. She was to wean this past year but has  not done this yet. She is currently taking 25 mg daily and does not want a refill at this time. She plans on weaning off of the spironolactone to see how she does. 4. Pap smear 2012. Pap/HPV today. No history of significant abnormal Pap smears previously. 5. Breast health. SBE monthly reviewed. Plan mammography closer to 40. 6. Health maintenance. Baseline CBC comprehensive metabolic panel lipid profile urinalysis ordered along with her STD b[place]ENTISS HAMMET[place] up in one year, sooner as needed.     Traci Lords MD, 10:52 AM 01/30/2014

## 2014-01-31 LAB — URINALYSIS W MICROSCOPIC + REFLEX CULTURE
Bacteria, UA: NONE SEEN
Bilirubin Urine: NEGATIVE
CASTS: NONE SEEN
GLUCOSE, UA: NEGATIVE mg/dL
HGB URINE DIPSTICK: NEGATIVE
Ketones, ur: NEGATIVE mg/dL
LEUKOCYTES UA: NEGATIVE
NITRITE: NEGATIVE
PH: 8 (ref 5.0–8.0)
PROTEIN: NEGATIVE mg/dL
SQUAMOUS EPITHELIAL / LPF: NONE SEEN
Specific Gravity, Urine: 1.011 (ref 1.005–1.030)
Urobilinogen, UA: 0.2 mg/dL (ref 0.0–1.0)

## 2014-01-31 LAB — GC/CHLAMYDIA PROBE AMP
CT PROBE, AMP APTIMA: NEGATIVE
GC PROBE AMP APTIMA: NEGATIVE

## 2014-01-31 LAB — HEPATITIS C ANTIBODY: HCV AB: NEGATIVE

## 2014-01-31 LAB — HEPATITIS B SURFACE ANTIGEN: Hepatitis B Surface Ag: NEGATIVE

## 2014-01-31 LAB — CYTOLOGY - PAP

## 2014-10-20 ENCOUNTER — Telehealth: Payer: Self-pay | Admitting: Internal Medicine

## 2014-10-20 NOTE — Telephone Encounter (Signed)
Patient is in school and need Dr Alwyn Ren to sign off on her immunization records, she haven't been here in 3 years and would like to see him again as her primary care doctor, please advise

## 2014-10-21 ENCOUNTER — Ambulatory Visit (INDEPENDENT_AMBULATORY_CARE_PROVIDER_SITE_OTHER): Payer: BLUE CROSS/BLUE SHIELD | Admitting: Internal Medicine

## 2014-10-21 ENCOUNTER — Other Ambulatory Visit: Payer: BLUE CROSS/BLUE SHIELD

## 2014-10-21 ENCOUNTER — Encounter: Payer: Self-pay | Admitting: Internal Medicine

## 2014-10-21 ENCOUNTER — Ambulatory Visit: Payer: BLUE CROSS/BLUE SHIELD

## 2014-10-21 VITALS — BP 110/62 | HR 59 | Temp 97.7°F | Resp 14 | Wt 146.0 lb

## 2014-10-21 DIAGNOSIS — Z0184 Encounter for antibody response examination: Secondary | ICD-10-CM

## 2014-10-21 NOTE — Progress Notes (Signed)
Pre visit review using our clinic review tool, if applicable. No additional management support is needed unless otherwise documented below in the visit note. 

## 2014-10-21 NOTE — Progress Notes (Signed)
   Subjective:    Patient ID: Traci Best, female    DOB: 21-Dec-1980, 34 y.o.   MRN: [id number]  HPI    Review of Systems     Objective:   Physical Exam        Assessment & Plan:  Immunization record reviewed and signed. Needed immunization titers ordered.  Request made for office visit to be changed to nurse visit.

## 2014-10-23 LAB — VARICELLA ZOSTER ABS, IGG/IGM
VARICELLA: 3635 {index} (ref >165)
Varicella IgM: <0.91 index (ref 0.00–0.90)

## 2015-04-29 ENCOUNTER — Encounter: Payer: Self-pay | Admitting: Gynecology

## 2015-04-29 ENCOUNTER — Ambulatory Visit (INDEPENDENT_AMBULATORY_CARE_PROVIDER_SITE_OTHER): Payer: BLUE CROSS/BLUE SHIELD | Admitting: Gynecology

## 2015-04-29 VITALS — BP 120/74 | Ht 69.0 in | Wt 134.0 lb

## 2015-04-29 DIAGNOSIS — Z01419 Encounter for gynecological examination (general) (routine) without abnormal findings: Secondary | ICD-10-CM | POA: Diagnosis not present

## 2015-04-29 LAB — CBC WITH DIFFERENTIAL/PLATELET
BASOS PCT: 0 % (ref 0–1)
Basophils Absolute: 0 10*3/uL (ref 0.0–0.1)
EOS ABS: 0.1 10*3/uL (ref 0.0–0.7)
Eosinophils Relative: 2 % (ref 0–5)
HCT: 38.3 % (ref 36.0–46.0)
Hemoglobin: 13 g/dL (ref 12.0–15.0)
Lymphocytes Relative: 34 % (ref 12–46)
Lymphs Abs: 1.7 10*3/uL (ref 0.7–4.0)
MCH: 30.6 pg (ref 26.0–34.0)
MCHC: 33.9 g/dL (ref 30.0–36.0)
MCV: 90.1 fL (ref 78.0–100.0)
MONOS PCT: 9 % (ref 3–12)
MPV: 10.1 fL (ref 8.6–12.4)
Monocytes Absolute: 0.4 10*3/uL (ref 0.1–1.0)
NEUTROS PCT: 55 % (ref 43–77)
Neutro Abs: 2.7 10*3/uL (ref 1.7–7.7)
PLATELETS: 248 10*3/uL (ref 150–400)
RBC: 4.25 MIL/uL (ref 3.87–5.11)
RDW: 13.2 % (ref 11.5–15.5)
WBC: 4.9 10*3/uL (ref 4.0–10.5)

## 2015-04-29 LAB — COMPREHENSIVE METABOLIC PANEL
ALT: 34 U/L — ABNORMAL HIGH (ref 6–29)
AST: 16 U/L (ref 10–30)
Albumin: 4.2 g/dL (ref 3.6–5.1)
Alkaline Phosphatase: 56 U/L (ref 33–115)
BILIRUBIN TOTAL: 0.6 mg/dL (ref 0.2–1.2)
BUN: 10 mg/dL (ref 7–25)
CHLORIDE: 100 mmol/L (ref 98–110)
CO2: 28 mmol/L (ref 20–31)
CREATININE: 0.56 mg/dL (ref 0.50–1.10)
Calcium: 9.3 mg/dL (ref 8.6–10.2)
Glucose, Bld: 89 mg/dL (ref 65–99)
Potassium: 3.9 mmol/L (ref 3.5–5.3)
SODIUM: 134 mmol/L — AB (ref 135–146)
Total Protein: 6.8 g/dL (ref 6.1–8.1)

## 2015-04-29 MED ORDER — ALPRAZOLAM 0.25 MG PO TABS: 0.2500 mg | ORAL_TABLET | Freq: Every evening | ORAL | Status: DC | PRN

## 2015-04-29 NOTE — Progress Notes (Signed)
Tenna DelaineCaroline A Nickle 01/05/1981 [id number]        34 y.o.  G0P0  for annual exam.  Doing well without complaints.  Past medical history,surgical history, problem list, medications, allergies, family history and social history were all reviewed and documented as reviewed in the EPIC chart.  ROS:  Performed with pertinent positives and negatives included in the history, assessment and plan.   Additional significant findings :  none   Exam: [facility] personassistant Filed Vitals:   04/29/15 0850  BP: 120/74  Height: 5\' 9"  (1.753 m)  Weight: 134 lb (60.782 kg)   General appearance:  Normal affect, orientation and appearance. Skin: Grossly normal HEENT: Without gross lesions.  No cervical or supraclavicular adenopathy. Thyroid normal.  Lungs:  Clear without wheezing, rales or rhonchi Cardiac: RR, without RMG Abdominal:  Soft, nontender, without masses, guarding, rebound, organomegaly or hernia Breasts:  Examined lying and sitting without masses, retractions, discharge or axillary adenopathy. Pelvic:  Ext/BUS/vagina normal  Cervix normal  Uterus anteverted, normal size, shape and contour, midline and mobile nontender   Adnexa  Without masses or tenderness    Anus and perineum  Normal    Assessment/Plan:  34 y.o. G0P0 female for annual exam with regular menses, not sexually active.   1. Contraception. Patient not sexually active and does not want any contraception. She'll call if she anticipates activity and we will we rediscussed contraceptive options. 2. Pap smear/HPV 2015 negative. No Pap smear done today. No history of abnormal Pap smears previously. Plan repeat Pap smear in 3-5 year interval per current screening guidelines. 3. Breast health. SBE monthly reviewed. Plan mammogram closer to 40. 4. Occasional insomnia. Used one of her mother's Xanax and did well with this and asked if she could have a prescription to have on hand. Xanax 0.25 mg #32 refills provided. 5. Health maintenance. Baseline  CBC comprehensive metabolic panel urinalysis ordered. Lipid profile last year normal and not repeated this year. Follow up in one year, sooner as needed.   Dara LordsFONTAINE,Kadey Mihalic P MD, 9:16 AM 04/29/2015

## 2015-04-29 NOTE — Patient Instructions (Signed)

## 2015-04-30 LAB — URINALYSIS W MICROSCOPIC + REFLEX CULTURE
Bilirubin Urine: NEGATIVE
CASTS: NONE SEEN [LPF]
CRYSTALS: NONE SEEN [HPF]
GLUCOSE, UA: NEGATIVE
Hgb urine dipstick: NEGATIVE
Ketones, ur: NEGATIVE
LEUKOCYTES UA: NEGATIVE
Nitrite: NEGATIVE
PROTEIN: NEGATIVE
Specific Gravity, Urine: 1.025 (ref 1.001–1.035)
WBC, UA: NONE SEEN WBC/HPF (ref <=5)
YEAST: NONE SEEN [HPF]
pH: 6 (ref 5.0–8.0)

## 2015-05-01 LAB — URINE CULTURE: Colony Count: 15000

## 2015-08-13 DIAGNOSIS — E063 Autoimmune thyroiditis: Secondary | ICD-10-CM | POA: Diagnosis not present

## 2015-08-19 DIAGNOSIS — F4310 Post-traumatic stress disorder, unspecified: Secondary | ICD-10-CM | POA: Diagnosis not present

## 2015-08-26 DIAGNOSIS — F4310 Post-traumatic stress disorder, unspecified: Secondary | ICD-10-CM | POA: Diagnosis not present

## 2015-09-02 DIAGNOSIS — F4310 Post-traumatic stress disorder, unspecified: Secondary | ICD-10-CM | POA: Diagnosis not present

## 2015-09-09 DIAGNOSIS — F4310 Post-traumatic stress disorder, unspecified: Secondary | ICD-10-CM | POA: Diagnosis not present

## 2015-09-16 DIAGNOSIS — F4310 Post-traumatic stress disorder, unspecified: Secondary | ICD-10-CM | POA: Diagnosis not present

## 2015-09-23 DIAGNOSIS — F4310 Post-traumatic stress disorder, unspecified: Secondary | ICD-10-CM | POA: Diagnosis not present

## 2015-09-30 ENCOUNTER — Ambulatory Visit (INDEPENDENT_AMBULATORY_CARE_PROVIDER_SITE_OTHER): Payer: BLUE CROSS/BLUE SHIELD | Admitting: Gynecology

## 2015-09-30 ENCOUNTER — Encounter: Payer: Self-pay | Admitting: Gynecology

## 2015-09-30 VITALS — BP 110/72

## 2015-09-30 DIAGNOSIS — R350 Frequency of micturition: Secondary | ICD-10-CM | POA: Diagnosis not present

## 2015-09-30 DIAGNOSIS — N898 Other specified noninflammatory disorders of vagina: Secondary | ICD-10-CM

## 2015-09-30 DIAGNOSIS — L298 Other pruritus: Secondary | ICD-10-CM | POA: Diagnosis not present

## 2015-09-30 DIAGNOSIS — Z113 Encounter for screening for infections with a predominantly sexual mode of transmission: Secondary | ICD-10-CM

## 2015-09-30 LAB — WET PREP FOR TRICH, YEAST, CLUE
TRICH WET PREP: NONE SEEN
Yeast Wet Prep HPF POC: NONE SEEN

## 2015-09-30 LAB — URINALYSIS W MICROSCOPIC + REFLEX CULTURE
BILIRUBIN URINE: NEGATIVE
CRYSTALS: NONE SEEN [HPF]
Casts: NONE SEEN [LPF]
GLUCOSE, UA: NEGATIVE
HGB URINE DIPSTICK: NEGATIVE
Ketones, ur: NEGATIVE
LEUKOCYTES UA: NEGATIVE
Nitrite: NEGATIVE
PH: 6 (ref 5.0–8.0)
PROTEIN: NEGATIVE
RBC / HPF: NONE SEEN RBC/HPF (ref <=2)
Specific Gravity, Urine: <1.005 (ref 1.001–1.035)
Yeast: NONE SEEN [HPF]

## 2015-09-30 LAB — HIV ANTIBODY (ROUTINE TESTING W REFLEX): HIV: NONREACTIVE

## 2015-09-30 MED ORDER — PHENAZOPYRIDINE HCL 200 MG PO TABS: 200.0000 mg | ORAL_TABLET | Freq: Three times a day (TID) | ORAL | Status: DC | PRN

## 2015-09-30 MED ORDER — TINIDAZOLE 500 MG PO TABS: ORAL_TABLET | ORAL | Status: DC

## 2015-09-30 NOTE — Patient Instructions (Signed)
Tinidazole tablets What is this medicine? TINIDAZOLE (tye NI da zole) is an antiinfective. It is used to treat amebiasis, giardiasis, trichomoniasis, and vaginosis. It will not work for colds, flu, or other viral infections. This medicine may be used for other purposes; ask your health care provider or pharmacist if you have questions. What should I tell my health care provider before I take this medicine? They need to know if you have any of these conditions: -anemia or other blood disorders -if you frequently drink alcohol containing drinks -receiving hemodialysis -seizure disorder -an unusual or allergic reaction to tinidazole, other medicines, foods, dyes, or preservatives -pregnant or trying to get pregnant -breast-feeding How should I use this medicine? Take this medicine by mouth with a full glass of water. Follow the directions on the prescription label. Take with food. Take your medicine at regular intervals. Do not take your medicine more often than directed. Take all of your medicine as directed even if you think you are better. Do not skip doses or stop your medicine early. Talk to your pediatrician regarding the use of this medicine in children. While this drug may be prescribed for children as young as 3 years of age for selected conditions, precautions do apply. Overdosage: If you think you have taken too much of this medicine contact a poison control center or emergency room at once. NOTE: This medicine is only for you. Do not share this medicine with others. What if I miss a dose? If you miss a dose, take it as soon as you can. If it is almost time for your next dose, take only that dose. Do not take double or extra doses. What may interact with this medicine? Do not take this medicine with any of the following medications: -alcohol or any product that contains alcohol -amprenavir oral solution -disulfiram -paclitaxel injection -ritonavir oral solution -sertraline oral  solution -sulfamethoxazole-trimethoprim injection This medicine may also interact with the following medications: -cholestyramine -cimetidine -conivaptan -cyclosporin -fluorouracil -fosphenytoin, phenytoin -ketoconazole -lithium -phenobarbital -tacrolimus -warfarin This list may not describe all possible interactions. Give your health care provider a list of all the medicines, herbs, non-prescription drugs, or dietary supplements you use. Also tell them if you smoke, drink alcohol, or use illegal drugs. Some items may interact with your medicine. What should I watch for while using this medicine? Tell your doctor or health care professional if your symptoms do not improve or if they get worse. Avoid alcoholic drinks while you are taking this medicine and for three days afterward. Alcohol may make you feel dizzy, sick, or flushed. If you are being treated for a sexually transmitted disease, avoid sexual contact until you have finished your treatment. Your sexual partner may also need treatment. What side effects may I notice from receiving this medicine? Side effects that you should report to your doctor or health care professional as soon as possible: -allergic reactions like skin rash, itching or hives, swelling of the face, lips, or tongue -breathing problems -confusion, depression -dark or white patches in the mouth -feeling faint or lightheaded, falls -fever, infection -numbness, tingling, pain or weakness in the hands or feet -pain when passing urine -seizures -unusually weak or tired -vaginal irritation or discharge -vomiting Side effects that usually do not require medical attention (report to your doctor or health care professional if they continue or are bothersome): -dark brown or reddish urine -diarrhea -headache -loss of appetite -metallic taste -nausea -stomach upset This list may not describe all possible side effects. Call   your doctor for medical advice about  side effects. You may report side effects to FDA at 1-800-FDA-1088. Where should I keep my medicine? Keep out of the reach of children. Store at room temperature between 15 and 30 degrees C (59 and 86 degrees F). Protect from light and moisture. Keep container tightly closed. Throw away any unused medicine after the expiration date. NOTE: This sheet is a summary. It may not cover all possible information. If you have questions about this medicine, talk to your doctor, pharmacist, or health care provider.    2016, Elsevier/Gold Standard. (2008-01-21 15:22:28) Bacterial Vaginosis Bacterial vaginosis is a vaginal infection that occurs when the normal balance of bacteria in the vagina is disrupted. It results from an overgrowth of certain bacteria. This is the most common vaginal infection in women of childbearing age. Treatment is important to prevent complications, especially in pregnant women, as it can cause a premature delivery. CAUSES  Bacterial vaginosis is caused by an increase in harmful bacteria that are normally present in smaller amounts in the vagina. Several different kinds of bacteria can cause bacterial vaginosis. However, the reason that the condition develops is not fully understood. RISK FACTORS Certain activities or behaviors can put you at an increased risk of developing bacterial vaginosis, including:  Having a new sex partner or multiple sex partners.  Douching.  Using an intrauterine device (IUD) for contraception. Women do not get bacterial vaginosis from toilet seats, bedding, swimming pools, or contact with objects around them. SIGNS AND SYMPTOMS  Some women with bacterial vaginosis have no signs or symptoms. Common symptoms include:  Grey vaginal discharge.  A fishlike odor with discharge, especially after sexual intercourse.  Itching or burning of the vagina and vulva.  Burning or pain with urination. DIAGNOSIS  Your health care provider will take a medical  history and examine the vagina for signs of bacterial vaginosis. A sample of vaginal fluid may be taken. Your health care provider will look at this sample under a microscope to check for bacteria and abnormal cells. A vaginal pH test may also be done.  TREATMENT  Bacterial vaginosis may be treated with antibiotic medicines. These may be given in the form of a pill or a vaginal cream. A second round of antibiotics may be prescribed if the condition comes back after treatment. Because bacterial vaginosis increases your risk for sexually transmitted diseases, getting treated can help reduce your risk for chlamydia, gonorrhea, HIV, and herpes. HOME CARE INSTRUCTIONS   Only take over-the-counter or prescription medicines as directed by your health care provider.  If antibiotic medicine was prescribed, take it as directed. Make sure you finish it even if you start to feel better.  Tell all sexual partners that you have a vaginal infection. They should see their health care provider and be treated if they have problems, such as a mild rash or itching.  During treatment, it is important that you follow these instructions:  Avoid sexual activity or use condoms correctly.  Do not douche.  Avoid alcohol as directed by your health care provider.  Avoid breastfeeding as directed by your health care provider. SEEK MEDICAL CARE IF:   Your symptoms are not improving after 3 days of treatment.  You have increased discharge or pain.  You have a fever. MAKE SURE YOU:   Understand these instructions.  Will watch your condition.  Will get help right away if you are not doing well or get worse. FOR MORE INFORMATION  Centers for Disease   Control and Prevention, Division of STD Prevention: www.cdc.gov/std American Sexual Health Association (ASHA): www.ashastd.org    This information is not intended to replace advice given to you by your health care provider. Make sure you discuss any questions you have  with your health care provider.   Document Released: 04/25/2005 Document Revised: 05/16/2014 Document Reviewed: 12/05/2012 Elsevier Interactive Patient Education 2016 Elsevier Inc.  

## 2015-09-30 NOTE — Addendum Note (Signed)
Addended by: Berna SpareASTILLO, Jerzy Crotteau A on: 09/30/2015 12:57 PM   Modules accepted: Orders

## 2015-09-30 NOTE — Progress Notes (Signed)
   HPI: Patient is a 35 year old presented to the office today complaining of vulvar pruritus and a slight discharge. Patient had unprotected intercourse this past weekend which would have been 3 days after her last menstrual cycle. She stated that this was a new partner. She was also having some pressure with voiding and some dysuria but no frequency. She denied any nausea, vomiting, fever, or chills. No change in appetite. Patient interested in STD screening.   ROS: A ROS was performed and pertinent positives and negatives are included in the history.  GENERAL: No fevers or chills. HEENT: No change in vision, no earache, sore throat or sinus congestion. NECK: No pain or stiffness. CARDIOVASCULAR: No chest pain or pressure. No palpitations. PULMONARY: No shortness of breath, cough or wheeze. GASTROINTESTINAL: No abdominal pain, nausea, vomiting or diarrhea, melena or bright red blood per rectum. GENITOURINARY: See above MUSCULOSKELETAL: No joint or muscle pain, no back pain, no recent trauma. DERMATOLOGIC: No rash, no itching, no lesions. ENDOCRINE: No polyuria, polydipsia, no heat or cold intolerance. No recent change in weight. HEMATOLOGICAL: No anemia or easy bruising or bleeding. NEUROLOGIC: No headache, seizures, numbness, tingling or weakness. PSYCHIATRIC: No depression, no loss of interest in normal activity or change in sleep pattern.   PE: Well-developed well-nourished female with above-mentioned complaining Back: No CVA tenderness Abdomen: Soft nontender no rebound or guarding Pelvic: Bartholin urethra Skene was within normal limits Vagina slight clear discharge was noted but no odor Cervix: No lesions or discharge Uterus: Anteverted normal size shape and consistency Adnexa: No palpable mass or tenderness Rectal exam: Not done  Urinalysis demonstrated only few bacteria specimen submitted for culture  Wet prep: Moderate clue cells, moderate white blood cells, many bacteria  GC and  Chlamydia culture pending at time of this dictation   Assessment Plan: Clinical evidence of bacterial vaginosis will be treated with Tindamax 500 mg tablets. She will take 4 tablets today and for talus tomorrow. A complete STD screening was obtained today consisting of the following: GC and Chlamydia culture, HIV, RPR, hepatitis B and C. I'm also going to prescribe her Pyridium 200 mg take 1 by mouth 3 times a day for 3 days to help some of her bladder discomfort as we confirm indeed if she does have a urinary tract infection since a urinalysis today demonstrated few bacteria.    Greater than 50% of time was spent in counseling and coordinating care of this patient.   Time of consultation: 15   Minutes.

## 2015-10-01 LAB — GC/CHLAMYDIA PROBE AMP
CT Probe RNA: NOT DETECTED
GC PROBE AMP APTIMA: NOT DETECTED

## 2015-10-01 LAB — HEPATITIS C ANTIBODY: HCV AB: NEGATIVE

## 2015-10-01 LAB — HEPATITIS B SURFACE ANTIGEN: HEP B S AG: NEGATIVE

## 2015-10-02 LAB — RPR

## 2015-10-02 LAB — URINE CULTURE: Colony Count: 3000

## 2015-10-07 DIAGNOSIS — L718 Other rosacea: Secondary | ICD-10-CM | POA: Diagnosis not present

## 2015-10-19 ENCOUNTER — Telehealth: Payer: Self-pay | Admitting: Vascular Surgery

## 2015-10-19 NOTE — Telephone Encounter (Signed)
Sched appt 8/1; lab 2:30 and md at 3:15. Spoke to pt to inform them of appt.

## 2015-10-19 NOTE — Telephone Encounter (Signed)
-----   Message from Micah FlesherSonya D Rankin, RN sent at 10/19/2015 10:35 AM EDT ----- Regarding: scheduling Please schedule Ms. Soler [phone]) for initial consult and bilateral venous reflux exam (bilateral VV with pain) between mid-July and Mid-August with Dr. Hart RochesterLawson.  Her family are friends with Dr. Hart RochesterLawson, and she is in college in [place] so her time frames are not flexible.   Thanks!

## 2015-10-21 DIAGNOSIS — F4310 Post-traumatic stress disorder, unspecified: Secondary | ICD-10-CM | POA: Diagnosis not present

## 2015-10-28 DIAGNOSIS — F4310 Post-traumatic stress disorder, unspecified: Secondary | ICD-10-CM | POA: Diagnosis not present

## 2015-11-04 DIAGNOSIS — F4310 Post-traumatic stress disorder, unspecified: Secondary | ICD-10-CM | POA: Diagnosis not present

## 2015-11-11 DIAGNOSIS — F4310 Post-traumatic stress disorder, unspecified: Secondary | ICD-10-CM | POA: Diagnosis not present

## 2015-11-14 DIAGNOSIS — R300 Dysuria: Secondary | ICD-10-CM | POA: Diagnosis not present

## 2015-11-18 DIAGNOSIS — F4310 Post-traumatic stress disorder, unspecified: Secondary | ICD-10-CM | POA: Diagnosis not present

## 2015-11-25 DIAGNOSIS — F4310 Post-traumatic stress disorder, unspecified: Secondary | ICD-10-CM | POA: Diagnosis not present

## 2015-12-04 ENCOUNTER — Encounter: Payer: Self-pay | Admitting: Vascular Surgery

## 2015-12-07 ENCOUNTER — Other Ambulatory Visit: Payer: Self-pay | Admitting: *Deleted

## 2015-12-07 DIAGNOSIS — I83893 Varicose veins of bilateral lower extremities with other complications: Secondary | ICD-10-CM

## 2015-12-08 ENCOUNTER — Ambulatory Visit (HOSPITAL_COMMUNITY)
Admission: RE | Admit: 2015-12-08 | Discharge: 2015-12-08 | Disposition: A | Discharge status: Home / Self Care | Payer: BLUE CROSS/BLUE SHIELD | Source: Ambulatory Visit | Attending: Vascular Surgery | Admitting: Vascular Surgery

## 2015-12-08 ENCOUNTER — Encounter: Payer: Self-pay | Admitting: Vascular Surgery

## 2015-12-08 ENCOUNTER — Ambulatory Visit (INDEPENDENT_AMBULATORY_CARE_PROVIDER_SITE_OTHER): Payer: BLUE CROSS/BLUE SHIELD | Admitting: Vascular Surgery

## 2015-12-08 VITALS — BP 103/74 | HR 64 | Temp 98.1°F | Resp 14 | Ht 68.5 in | Wt 143.0 lb

## 2015-12-08 DIAGNOSIS — I83893 Varicose veins of bilateral lower extremities with other complications: Secondary | ICD-10-CM

## 2015-12-08 DIAGNOSIS — M79604 Pain in right leg: Secondary | ICD-10-CM | POA: Diagnosis not present

## 2015-12-08 DIAGNOSIS — M79605 Pain in left leg: Secondary | ICD-10-CM

## 2015-12-08 DIAGNOSIS — R609 Edema, unspecified: Secondary | ICD-10-CM | POA: Diagnosis not present

## 2015-12-08 DIAGNOSIS — E079 Disorder of thyroid, unspecified: Secondary | ICD-10-CM | POA: Insufficient documentation

## 2015-12-08 NOTE — Progress Notes (Signed)
Subjective:     Patient ID: Traci Best, female   DOB: [birth year], 35 y.o.   MRN: [id number]  HPI this 35 year old healthy female has noticed bilateral posterior calf pain and occasional bluish discoloration. She is concerned about venous disease. She does not develop swelling as the day progresses. She has no history of DVT thrombophlebitis stasis ulcers or bleeding. She is able to ambulate long distances. Her symptoms are usually relieved by elevation.  Past Medical History:  Diagnosis Date  . Acne   . Thyroid disease    [place]  . Varicose veins     Social History  Substance Use Topics  . Smoking status: Never Smoker  . Smokeless tobacco: Never Used  . Alcohol use 0.0 oz/week     Comment: Social    Family History  Problem Relation Age of Onset  . Cancer Mother     UTERINE CA  . Hyperlipidemia Mother   . Hyperlipidemia Father   . Arrhythmia Father   . Thyroid disease Maternal Aunt   . Thyroid disease Maternal Grandmother   . Hyperlipidemia Maternal Grandmother   . Dementia Maternal Grandmother   . Hyperlipidemia Maternal Grandfather   . Hyperlipidemia Paternal Grandmother   . Hyperlipidemia Paternal Grandfather     Allergies  Allergen Reactions  . Corn-Containing Products   . Gluten Meal   . Wheat Bran      Current Outpatient Prescriptions:  .  ALPRAZolam (XANAX) 0.25 MG tablet, Take 1 tablet (0.25 mg total) by mouth at bedtime as needed for anxiety., Disp: 30 tablet, Rfl: 2 .  Levothyroxine Sodium (TIROSINT) 75 MCG CAPS, Take 88 mcg by mouth daily before breakfast. , Disp: , Rfl:  .  phenazopyridine (PYRIDIUM) 200 MG tablet, Take 1 tablet (200 mg total) by mouth 3 (three) times daily as needed for pain. (Patient not taking: Reported on 12/08/2015), Disp: 9 tablet, Rfl: 0 .  tinidazole (TINDAMAX) 500 MG tablet, Take four tablets today and four tablets tomorrow at the same time (Patient not taking: Reported on 12/08/2015), Disp: 8 tablet, Rfl: 0  Vitals:   12/08/15 1453  BP: 103/74  Pulse: 64  Resp: 14  Temp: 98.1 F (36.7 C)  SpO2: 100%  Weight: 143 lb (64.9 kg)  Height: 5' 8.5" (1.74 m)    Body mass index is 21.43 kg/m.          Review of Systems denies chest pain, dyspnea on exertion, PND, orthopnea. Does have occasional problems with acne and has history of hypothyroidism which has been treated other systems negative and complete review of systems     Objective:   Physical Exam BP 103/74 (BP Location: Left Arm, Patient Position: Sitting, Cuff Size: Normal)   Pulse 64   Temp 98.1 F (36.7 C)   Resp 14   Ht 5' 8.5" (1.74 m)   Wt 143 lb (64.9 kg)   SpO2 100%   BMI 21.43 kg/m     Gen.-alert and oriented x3 in no apparent distress HEENT normal for age Lungs no rhonchi or wheezing Cardiovascular regular rhythm no murmurs carotid pulses 3+ palpable no bruits audible Abdomen soft nontender no palpable masses Musculoskeletal free of  major deformities Skin clear -no rashes Neurologic normal Lower extremities 3+ femoral and dorsalis pedis pulses palpable bilaterally with no edema No definite varicosities are noted. A few prominent veins are noted in the posterior calf where the patient relates pain and discomfort. No hyperpigmentation ulceration or stigmata of significant venous disease noted  Today  I ordered a venous duplex exam of both legs to reviewed and interpreted. There is no DVT. There is no reflux in the superficial or deep systems bilaterally       Assessment:     Bilateral leg pain-etiology unknown No evidence of arterial or venous insufficiency    Plan:     No treatment recommended Return to see me on a when necessary basis if evidence of venous disease surfaces

## 2016-01-01 ENCOUNTER — Telehealth: Payer: Self-pay

## 2016-01-01 NOTE — Telephone Encounter (Signed)
-----   Message from Fayrene FearingJulie R Johnston sent at 12/09/2015  9:22 AM EDT ----- Needs PCP  ----- Message ----- From: Pecola LawlessWilliam F Hopper, MD Sent: 12/08/2015   5:15 PM To: Fayrene FearingJulie R Johnston  Please verify patient's new primary care physician and route reports to that individual. Thank you very much. ----- Message ----- From: Pryor OchoaJames D Lawson, MD Sent: 12/08/2015   3:14 PM To: Pecola LawlessWilliam F Hopper, MD

## 2016-01-01 NOTE — Progress Notes (Signed)
LVM for pt to call back as soon as possible.   RE: needs appt with new PCP

## 2016-01-01 NOTE — Telephone Encounter (Signed)
Pt stating she will find a new PCP in [place] or other places. Thank you

## 2016-01-06 DIAGNOSIS — F4310 Post-traumatic stress disorder, unspecified: Secondary | ICD-10-CM | POA: Diagnosis not present

## 2016-01-14 DIAGNOSIS — F4310 Post-traumatic stress disorder, unspecified: Secondary | ICD-10-CM | POA: Diagnosis not present

## 2016-01-21 DIAGNOSIS — F4310 Post-traumatic stress disorder, unspecified: Secondary | ICD-10-CM | POA: Diagnosis not present

## 2016-02-04 DIAGNOSIS — F4310 Post-traumatic stress disorder, unspecified: Secondary | ICD-10-CM | POA: Diagnosis not present

## 2016-02-11 DIAGNOSIS — F4310 Post-traumatic stress disorder, unspecified: Secondary | ICD-10-CM | POA: Diagnosis not present

## 2016-02-18 DIAGNOSIS — F4310 Post-traumatic stress disorder, unspecified: Secondary | ICD-10-CM | POA: Diagnosis not present

## 2016-03-30 DIAGNOSIS — S93521A Sprain of metatarsophalangeal joint of right great toe, initial encounter: Secondary | ICD-10-CM | POA: Diagnosis not present

## 2016-05-11 ENCOUNTER — Ambulatory Visit (INDEPENDENT_AMBULATORY_CARE_PROVIDER_SITE_OTHER): Payer: BLUE CROSS/BLUE SHIELD | Admitting: Gynecology

## 2016-05-11 ENCOUNTER — Encounter: Payer: Self-pay | Admitting: Gynecology

## 2016-05-11 VITALS — BP 120/76 | Ht 68.5 in | Wt 150.0 lb

## 2016-05-11 DIAGNOSIS — Z01419 Encounter for gynecological examination (general) (routine) without abnormal findings: Secondary | ICD-10-CM | POA: Diagnosis not present

## 2016-05-11 DIAGNOSIS — Z13220 Encounter for screening for lipoid disorders: Secondary | ICD-10-CM | POA: Diagnosis not present

## 2016-05-11 LAB — CBC WITH DIFFERENTIAL/PLATELET
Basophils Absolute: 66 cells/uL (ref 0–200)
Basophils Relative: 1 %
Eosinophils Absolute: 66 cells/uL (ref 15–500)
Eosinophils Relative: 1 %
HEMATOCRIT: 38.3 % (ref 35.0–45.0)
HEMOGLOBIN: 12.6 g/dL (ref 11.7–15.5)
LYMPHS ABS: 2178 {cells}/uL (ref 850–3900)
LYMPHS PCT: 33 %
MCH: 30.3 pg (ref 27.0–33.0)
MCHC: 32.9 g/dL (ref 32.0–36.0)
MCV: 92.1 fL (ref 80.0–100.0)
MONO ABS: 396 {cells}/uL (ref 200–950)
MPV: 9.9 fL (ref 7.5–12.5)
Monocytes Relative: 6 %
NEUTROS PCT: 59 %
Neutro Abs: 3894 cells/uL (ref 1500–7800)
Platelets: 290 10*3/uL (ref 140–400)
RBC: 4.16 MIL/uL (ref 3.80–5.10)
RDW: 13.6 % (ref 11.0–15.0)
WBC: 6.6 10*3/uL (ref 3.8–10.8)

## 2016-05-11 LAB — COMPREHENSIVE METABOLIC PANEL
ALBUMIN: 4.7 g/dL (ref 3.6–5.1)
ALT: 46 U/L — ABNORMAL HIGH (ref 6–29)
AST: 106 U/L — AB (ref 10–30)
Alkaline Phosphatase: 51 U/L (ref 33–115)
BILIRUBIN TOTAL: 0.4 mg/dL (ref 0.2–1.2)
BUN: 10 mg/dL (ref 7–25)
CALCIUM: 9.1 mg/dL (ref 8.6–10.2)
CO2: 24 mmol/L (ref 20–31)
Chloride: 106 mmol/L (ref 98–110)
Creat: 0.68 mg/dL (ref 0.50–1.10)
GLUCOSE: 89 mg/dL (ref 65–99)
Potassium: 4.1 mmol/L (ref 3.5–5.3)
Sodium: 139 mmol/L (ref 135–146)
Total Protein: 6.9 g/dL (ref 6.1–8.1)

## 2016-05-11 LAB — LIPID PANEL
CHOLESTEROL: 182 mg/dL (ref <200)
HDL: 89 mg/dL (ref >50)
LDL CALC: 82 mg/dL (ref <100)
TRIGLYCERIDES: 57 mg/dL (ref <150)
Total CHOL/HDL Ratio: 2 Ratio (ref <5.0)
VLDL: 11 mg/dL (ref <30)

## 2016-05-11 MED ORDER — ALPRAZOLAM 0.25 MG PO TABS: 0.2500 mg | ORAL_TABLET | Freq: Every evening | ORAL | 2 refills | Status: DC | PRN

## 2016-05-11 NOTE — Patient Instructions (Signed)

## 2016-05-11 NOTE — Progress Notes (Signed)
    Tenna DelaineCaroline A Castelli 14-Nov-1980 [id number]        36 y.o.  G0P0 for annual exam.    Past medical history,surgical history, problem list, medications, allergies, family history and social history were all reviewed and documented as reviewed in the EPIC chart.  ROS:  Performed with pertinent positives and negatives included in the history, assessment and plan.   Additional significant findings :  None   Exam: Kennon PortelaKim Gardner assistant Vitals:   05/11/16 1518  BP: 120/76  Weight: 150 lb (68 kg)  Height: 5' 8.5" (1.74 m)   Body mass index is 22.48 kg/m.  General appearance:  Normal affect, orientation and appearance. Skin: Grossly normal HEENT: Without gross lesions.  No cervical or supraclavicular adenopathy. Thyroid normal.  Lungs:  Clear without wheezing, rales or rhonchi Cardiac: RR, without RMG Abdominal:  Soft, nontender, without masses, guarding, rebound, organomegaly or hernia Breasts:  Examined lying and sitting without masses, retractions, discharge or axillary adenopathy. Pelvic:  Ext, BUS, Vagina normal  Cervix normal  Uterus anteverted, normal size, shape and contour, midline and mobile nontender   Adnexa without masses or tenderness    Anus and perineum normal   Rectovaginal normal sphincter tone without palpated masses or tenderness.    Assessment/Plan:  36 y.o. G0P0 female for annual exam with regular menses, not currently sexually active.   1. Contraception. Patient states is not an issue and does not desire contraception. 2. Pap smear/HPV 2015 negative. No Pap smear done today. No history of significant abnormal Pap smears. Plan repeat Pap smear approaching 5 year interval per current screening guidelines. 3. Mammography 2013. SBE monthly reviewed. We'll plan follow up mammogram at age 36. 4. Xanax 0.25 #30 with 2 refills provided for occasional anxiety/flying. Does well with this 5. Health maintenance. Baseline CBC, CMP, lipid profile, urinalysis ordered. Follow  up 1 year, sooner as needed.  Dara LordsFONTAINE,Aleyza Salmi P MD, 3:42 PM 05/11/2016

## 2016-05-12 LAB — URINALYSIS W MICROSCOPIC + REFLEX CULTURE
BACTERIA UA: NONE SEEN [HPF]
BILIRUBIN URINE: NEGATIVE
CRYSTALS: NONE SEEN [HPF]
Casts: NONE SEEN [LPF]
Glucose, UA: NEGATIVE
HGB URINE DIPSTICK: NEGATIVE
Ketones, ur: NEGATIVE
Leukocytes, UA: NEGATIVE
Nitrite: NEGATIVE
PROTEIN: NEGATIVE
RBC / HPF: NONE SEEN RBC/HPF (ref <=2)
Specific Gravity, Urine: 1.013 (ref 1.001–1.035)
Yeast: NONE SEEN [HPF]
pH: 6 (ref 5.0–8.0)

## 2016-05-13 ENCOUNTER — Other Ambulatory Visit: Payer: Self-pay | Admitting: *Deleted

## 2016-05-13 ENCOUNTER — Other Ambulatory Visit: Payer: Self-pay | Admitting: Gynecology

## 2016-05-13 DIAGNOSIS — R945 Abnormal results of liver function studies: Principal | ICD-10-CM

## 2016-05-13 DIAGNOSIS — R7989 Other specified abnormal findings of blood chemistry: Secondary | ICD-10-CM

## 2016-05-13 LAB — URINE CULTURE

## 2016-05-13 MED ORDER — AMPICILLIN 500 MG PO CAPS: 500.0000 mg | ORAL_CAPSULE | Freq: Four times a day (QID) | ORAL | 0 refills | Status: DC

## 2016-05-16 ENCOUNTER — Telehealth: Payer: Self-pay | Admitting: *Deleted

## 2016-05-16 NOTE — Telephone Encounter (Signed)
Pt informed with the below note, coming on 05/18/16 @ 8:45am for repeat lab

## 2016-05-16 NOTE — Telephone Encounter (Signed)
She could have them repeated before she leaves. I doubt this is related to leg swelling.

## 2016-05-16 NOTE — Telephone Encounter (Signed)
Pt was informed with repeat liver function test in 1-2 months, pt will be leaving for [place] and wont be back until March. Patient said she is worried about results, states she went urgent care for a toe issue and the MD mention her legs were swollen, patient states she has circulation issues anyway and elevates her legs and bedtime, no pain in either legs. She asked if you think labs should repeated sooner? Or not to worry? Please advise

## 2016-05-18 ENCOUNTER — Other Ambulatory Visit: Payer: BLUE CROSS/BLUE SHIELD

## 2016-05-18 DIAGNOSIS — R7989 Other specified abnormal findings of blood chemistry: Secondary | ICD-10-CM | POA: Diagnosis not present

## 2016-05-18 DIAGNOSIS — R945 Abnormal results of liver function studies: Principal | ICD-10-CM

## 2016-05-18 LAB — COMPREHENSIVE METABOLIC PANEL
ALBUMIN: 4.4 g/dL (ref 3.6–5.1)
ALT: 21 U/L (ref 6–29)
AST: 16 U/L (ref 10–30)
Alkaline Phosphatase: 61 U/L (ref 33–115)
BUN: 8 mg/dL (ref 7–25)
CALCIUM: 9.6 mg/dL (ref 8.6–10.2)
CHLORIDE: 105 mmol/L (ref 98–110)
CO2: 27 mmol/L (ref 20–31)
CREATININE: 0.72 mg/dL (ref 0.50–1.10)
Glucose, Bld: 80 mg/dL (ref 65–99)
Potassium: 4.4 mmol/L (ref 3.5–5.3)
SODIUM: 137 mmol/L (ref 135–146)
TOTAL PROTEIN: 7.1 g/dL (ref 6.1–8.1)
Total Bilirubin: 0.8 mg/dL (ref 0.2–1.2)

## 2016-06-28 DIAGNOSIS — L658 Other specified nonscarring hair loss: Secondary | ICD-10-CM | POA: Insufficient documentation

## 2016-07-11 DIAGNOSIS — Z85828 Personal history of other malignant neoplasm of skin: Secondary | ICD-10-CM | POA: Diagnosis not present

## 2016-07-11 DIAGNOSIS — L718 Other rosacea: Secondary | ICD-10-CM | POA: Diagnosis not present

## 2016-07-11 DIAGNOSIS — D2262 Melanocytic nevi of left upper limb, including shoulder: Secondary | ICD-10-CM | POA: Diagnosis not present

## 2016-07-11 DIAGNOSIS — L738 Other specified follicular disorders: Secondary | ICD-10-CM | POA: Diagnosis not present

## 2016-07-11 DIAGNOSIS — E063 Autoimmune thyroiditis: Secondary | ICD-10-CM | POA: Diagnosis not present

## 2016-07-12 DIAGNOSIS — E063 Autoimmune thyroiditis: Secondary | ICD-10-CM | POA: Diagnosis not present

## 2016-09-20 ENCOUNTER — Ambulatory Visit (INDEPENDENT_AMBULATORY_CARE_PROVIDER_SITE_OTHER): Payer: BLUE CROSS/BLUE SHIELD | Admitting: Women's Health

## 2016-09-20 ENCOUNTER — Encounter: Payer: Self-pay | Admitting: Women's Health

## 2016-09-20 VITALS — BP 110/80 | Ht 68.0 in | Wt 150.0 lb

## 2016-09-20 DIAGNOSIS — R350 Frequency of micturition: Secondary | ICD-10-CM | POA: Diagnosis not present

## 2016-09-20 LAB — URINALYSIS W MICROSCOPIC + REFLEX CULTURE
Bilirubin Urine: NEGATIVE
Casts: NONE SEEN [LPF]
Crystals: NONE SEEN [HPF]
GLUCOSE, UA: NEGATIVE
HGB URINE DIPSTICK: NEGATIVE
Ketones, ur: NEGATIVE
LEUKOCYTES UA: NEGATIVE
NITRITE: NEGATIVE
Protein, ur: NEGATIVE
RBC / HPF: NONE SEEN RBC/HPF (ref <=2)
Specific Gravity, Urine: 1.01 (ref 1.001–1.035)
YEAST: NONE SEEN [HPF]
pH: 7 (ref 5.0–8.0)

## 2016-09-20 NOTE — Patient Instructions (Signed)
Call office for insurance coverage and Dr Audie BoxFontaine will place with cycle  Intrauterine Device Information An intrauterine device (IUD) is inserted into your uterus to prevent pregnancy. There are two types of IUDs available:  Copper IUD-This type of IUD is wrapped in copper wire and is placed inside the uterus. Copper makes the uterus and fallopian tubes produce a fluid that kills sperm. The copper IUD can stay in place for 10 years.  Hormone IUD-This type of IUD contains the hormone progestin (synthetic progesterone). The hormone thickens the cervical mucus and prevents sperm from entering the uterus. It also thins the uterine lining to prevent implantation of a fertilized egg. The hormone can weaken or kill the sperm that get into the uterus. One type of hormone IUD can stay in place for 5 years, and another type can stay in place for 3 years. Your health care provider will make sure you are a good candidate for a contraceptive IUD. Discuss with your health care provider the possible side effects. Advantages of an intrauterine device  IUDs are highly effective, reversible, long acting, and low maintenance.  There are no estrogen-related side effects.  An IUD can be used when breastfeeding.  IUDs are not associated with weight gain.  The copper IUD works immediately after insertion.  The hormone IUD works right away if inserted within 7 days of your period starting. You will need to use a backup method of birth control for 7 days if the hormone IUD is inserted at any other time in your cycle.  The copper IUD does not interfere with your female hormones.  The hormone IUD can make heavy menstrual periods lighter and decrease cramping.  The hormone IUD can be used for 3 or 5 years.  The copper IUD can be used for 10 years. Disadvantages of an intrauterine device  The hormone IUD can be associated with irregular bleeding patterns.  The copper IUD can make your menstrual flow heavier  and more painful.  You may experience cramping and vaginal bleeding after insertion. This information is not intended to replace advice given to you by your health care provider. Make sure you discuss any questions you have with your health care provider. Document Released: 03/29/2004 Document Revised: 10/01/2015 Document Reviewed: 10/14/2012 Elsevier Interactive Patient Education  2017 [employer]

## 2016-09-20 NOTE — Progress Notes (Signed)
Presents with complaint of increased urinary frequency with some urgency with no burning or pain. Used over-the-counter Azo yesterday and had relief of symptoms. Had a UTI in December and wanted to be sure she did not have one, leaving for semester in [place]. Denies vaginal discharge, itching, odor, abdominal pain or fever. Same partner with negative STD screen. Monthly cycle/condoms. Desires no children.  Exam: Appears well. No CVAT. UA: Negative leukocytes, 0-5 WBCs, 0-5 squamous epithelials, few bacteria.  Urinary frequency  Plan: Urine culture pending. Continue fluids, Azo as needed. UTI prevention discussed. Contraception reviewed, ParaGard IUD information given, reviewed slight risk for infection, perforation or hemorrhage. Reviewed if she would like to proceed will call office to check coverage and have Dr. Audie BoxFontaine place with  cycle.

## 2016-09-21 ENCOUNTER — Encounter: Payer: Self-pay | Admitting: Gynecology

## 2016-09-21 LAB — URINE CULTURE: Organism ID, Bacteria: NO GROWTH

## 2016-09-22 ENCOUNTER — Encounter: Payer: Self-pay | Admitting: Women's Health

## 2016-10-18 DIAGNOSIS — R5383 Other fatigue: Secondary | ICD-10-CM | POA: Diagnosis not present

## 2016-10-18 DIAGNOSIS — R946 Abnormal results of thyroid function studies: Secondary | ICD-10-CM | POA: Diagnosis not present

## 2016-12-26 DIAGNOSIS — L700 Acne vulgaris: Secondary | ICD-10-CM | POA: Diagnosis not present

## 2016-12-26 DIAGNOSIS — I788 Other diseases of capillaries: Secondary | ICD-10-CM | POA: Diagnosis not present

## 2016-12-27 DIAGNOSIS — E063 Autoimmune thyroiditis: Secondary | ICD-10-CM | POA: Diagnosis not present

## 2016-12-29 DIAGNOSIS — H5213 Myopia, bilateral: Secondary | ICD-10-CM | POA: Diagnosis not present

## 2016-12-29 DIAGNOSIS — H52203 Unspecified astigmatism, bilateral: Secondary | ICD-10-CM | POA: Diagnosis not present

## 2016-12-30 DIAGNOSIS — E063 Autoimmune thyroiditis: Secondary | ICD-10-CM | POA: Diagnosis not present

## 2016-12-30 DIAGNOSIS — Z8349 Family history of other endocrine, nutritional and metabolic diseases: Secondary | ICD-10-CM | POA: Diagnosis not present

## 2017-07-10 DIAGNOSIS — E063 Autoimmune thyroiditis: Secondary | ICD-10-CM | POA: Diagnosis not present

## 2017-07-11 ENCOUNTER — Encounter: Payer: Self-pay | Admitting: Gynecology

## 2017-07-11 ENCOUNTER — Ambulatory Visit: Payer: BLUE CROSS/BLUE SHIELD | Admitting: Gynecology

## 2017-07-11 VITALS — BP 114/76 | Ht 68.0 in | Wt 149.0 lb

## 2017-07-11 DIAGNOSIS — L718 Other rosacea: Secondary | ICD-10-CM | POA: Diagnosis not present

## 2017-07-11 DIAGNOSIS — R102 Pelvic and perineal pain: Secondary | ICD-10-CM | POA: Diagnosis not present

## 2017-07-11 DIAGNOSIS — Z113 Encounter for screening for infections with a predominantly sexual mode of transmission: Secondary | ICD-10-CM | POA: Diagnosis not present

## 2017-07-11 DIAGNOSIS — Z01419 Encounter for gynecological examination (general) (routine) without abnormal findings: Secondary | ICD-10-CM

## 2017-07-11 DIAGNOSIS — L738 Other specified follicular disorders: Secondary | ICD-10-CM | POA: Diagnosis not present

## 2017-07-11 NOTE — Progress Notes (Signed)
Traci Best June 25, 1980 [id number]        37 y.o.  G0P0000 for annual gynecologic exam.  Patient also notes over the last several months right lower quadrant intermittent fleeting pain that comes and last for minutes sharp stabbing to cramping.  No nausea vomiting diarrhea constipation.  No urinary symptoms such as dysuria frequency urgency low back pain fever or chills.  Seems unrelated to her menstrual cycle.  Unrelated to intercourse.  Currently sexually active and requests STD screening.  No known exposures but wants to be screened.  Using condoms which is acceptable.  Past medical history,surgical history, problem list, medications, allergies, family history and social history were all reviewed and documented as reviewed in the EPIC chart.  ROS:  Performed with pertinent positives and negatives included in the history, assessment and plan.   Additional significant findings : None   Exam: Kennon PortelaKim Gardner assistant Vitals:   07/11/17 1458  BP: 114/76  Weight: 149 lb (67.6 kg)  Height: 5\' 8"  (1.727 m)   Body mass index is 22.66 kg/m.  General appearance:  Normal affect, orientation and appearance. Skin: Grossly normal HEENT: Without gross lesions.  No cervical or supraclavicular adenopathy. Thyroid normal.  Lungs:  Clear without wheezing, rales or rhonchi Cardiac: RR, without RMG Abdominal:  Soft, nontender, without masses, guarding, rebound, organomegaly or hernia Breasts:  Examined lying and sitting without masses, retractions, discharge or axillary adenopathy. Pelvic:  Ext, BUS, Vagina: Normal  Cervix: Normal.  GC/Chlamydia  Uterus: Axial to anteverted, normal size, shape and contour, midline and mobile nontender   Adnexa: Without masses or tenderness    Anus and perineum: Normal   Rectovaginal: Normal sphincter tone without palpated masses or tenderness.    Assessment/Plan:  37 y.o. G0P0000 female for annual gynecologic exam with regular menses, condom contraception.     1. Right lower quadrant/pelvic pain.  Exam is normal.  Discussed differential to include GI, GU, GYN and orthopedic.  Will check urine analysis although no urinary symptoms.  No other localizing symptoms to suggest GI such as nausea vomiting diarrhea constipation.  Even in the absence of overt GI symptoms still possible for spastic colon as a source of her symptoms.  Reviewed possible mittelschmerz or normal ovulatory functioning.  Possibilities to include ovarian process such as cyst/endometriosis although no consistent discomfort and not associated with menses or intercourse.  Options to start with ultrasound now versus keeping a pain calendar and getting a better track record as far as when it occurs and what is associated with it reviewed.  Patient is comfortable with pain calendar for now and will follow up with these results if the pain persists. 2. Requests STD screening.  No known exposure.  GC/Chlamydia, HIV, RPR, hepatitis B, hepatitis C ordered. 3. Contraception.  I again reviewed contraception and she is happy with condoms.  Increased failure risk reviewed.  Availability of Plan B discussed. 4. Pap smear/HPV 01/2014.  No Pap smear done today.  Plan repeat Pap smear next year at 5-year interval per current screening guidelines.  No history of abnormal Pap smears previously. 5. Mammography 2013.  Breast exam normal today.  Plan follow-up mammogram at age 37. 126. Health maintenance.  Requests baseline labs.  Is followed by endocrinology with TSH.  CBC, CMP ordered.  Lipid profile last year excellent not repeated this year.  Follow-up if pain persists, follow-up in 1 year for annual exam.  Additional 10 minute time in excess of her routine gynecologic exam was spent  in direct face to face counseling and coordination of care in regards to her pelvic/right lower quadrant pain.    [name] MD, 3:27 PM 07/11/2017

## 2017-07-11 NOTE — Patient Instructions (Signed)
Follow-up with your pain calendar if the pain persists.  We will then consider further evaluation.  Follow-up in 1 year for annual exam.

## 2017-07-12 LAB — URINALYSIS, COMPLETE W/RFL CULTURE
BACTERIA UA: NONE SEEN /HPF
Bilirubin Urine: NEGATIVE
Glucose, UA: NEGATIVE
HGB URINE DIPSTICK: NEGATIVE
HYALINE CAST: NONE SEEN /LPF
KETONES UR: NEGATIVE
LEUKOCYTE ESTERASE: NEGATIVE
Nitrites, Initial: NEGATIVE
Protein, ur: NEGATIVE
RBC / HPF: NONE SEEN /HPF (ref 0–2)
SPECIFIC GRAVITY, URINE: 1.009 (ref 1.001–1.03)
SQUAMOUS EPITHELIAL / LPF: NONE SEEN /HPF (ref <=5)
WBC UA: NONE SEEN /HPF (ref 0–5)
pH: 7.5 (ref 5.0–8.0)

## 2017-07-12 LAB — COMPREHENSIVE METABOLIC PANEL
AG Ratio: 2.3 (calc) (ref 1.0–2.5)
ALT: 11 U/L (ref 6–29)
AST: 12 U/L (ref 10–30)
Albumin: 4.8 g/dL (ref 3.6–5.1)
Alkaline phosphatase (APISO): 51 U/L (ref 33–115)
BILIRUBIN TOTAL: 0.4 mg/dL (ref 0.2–1.2)
BUN: 10 mg/dL (ref 7–25)
CALCIUM: 9.4 mg/dL (ref 8.6–10.2)
CHLORIDE: 105 mmol/L (ref 98–110)
CO2: 29 mmol/L (ref 20–32)
Creat: 0.71 mg/dL (ref 0.50–1.10)
GLOBULIN: 2.1 g/dL (ref 1.9–3.7)
GLUCOSE: 81 mg/dL (ref 65–99)
Potassium: 4 mmol/L (ref 3.5–5.3)
SODIUM: 139 mmol/L (ref 135–146)
Total Protein: 6.9 g/dL (ref 6.1–8.1)

## 2017-07-12 LAB — HIV ANTIBODY (ROUTINE TESTING W REFLEX): HIV: NONREACTIVE

## 2017-07-12 LAB — CBC WITH DIFFERENTIAL/PLATELET
BASOS ABS: 49 {cells}/uL (ref 0–200)
Basophils Relative: 0.6 %
EOS ABS: 98 {cells}/uL (ref 15–500)
Eosinophils Relative: 1.2 %
HCT: 37.2 % (ref 35.0–45.0)
Hemoglobin: 12.8 g/dL (ref 11.7–15.5)
Lymphs Abs: 2444 cells/uL (ref 850–3900)
MCH: 30.9 pg (ref 27.0–33.0)
MCHC: 34.4 g/dL (ref 32.0–36.0)
MCV: 89.9 fL (ref 80.0–100.0)
MONOS PCT: 5.6 %
MPV: 10.9 fL (ref 7.5–12.5)
NEUTROS PCT: 62.8 %
Neutro Abs: 5150 cells/uL (ref 1500–7800)
PLATELETS: 281 10*3/uL (ref 140–400)
RBC: 4.14 10*6/uL (ref 3.80–5.10)
RDW: 11.9 % (ref 11.0–15.0)
Total Lymphocyte: 29.8 %
WBC mixed population: 459 cells/uL (ref 200–950)
WBC: 8.2 10*3/uL (ref 3.8–10.8)

## 2017-07-12 LAB — HEPATITIS C ANTIBODY
HEP C AB: NONREACTIVE
SIGNAL TO CUT-OFF: 0.02 (ref <1.00)

## 2017-07-12 LAB — URINE CULTURE
MICRO NUMBER:: 90281615
RESULT: NO GROWTH
SPECIMEN QUALITY: ADEQUATE

## 2017-07-12 LAB — NO CULTURE INDICATED

## 2017-07-12 LAB — HEPATITIS B SURFACE ANTIGEN: HEP B S AG: NONREACTIVE

## 2017-07-12 LAB — RPR: RPR Ser Ql: NONREACTIVE

## 2017-07-13 DIAGNOSIS — L658 Other specified nonscarring hair loss: Secondary | ICD-10-CM | POA: Diagnosis not present

## 2017-07-13 DIAGNOSIS — E063 Autoimmune thyroiditis: Secondary | ICD-10-CM | POA: Diagnosis not present

## 2017-07-13 DIAGNOSIS — Z79890 Hormone replacement therapy (postmenopausal): Secondary | ICD-10-CM | POA: Diagnosis not present

## 2017-07-13 LAB — C. TRACHOMATIS/N. GONORRHOEAE RNA
C. trachomatis RNA, TMA: NOT DETECTED
N. GONORRHOEAE RNA, TMA: NOT DETECTED

## 2017-07-16 DIAGNOSIS — M2550 Pain in unspecified joint: Secondary | ICD-10-CM | POA: Insufficient documentation

## 2017-07-16 DIAGNOSIS — R768 Other specified abnormal immunological findings in serum: Secondary | ICD-10-CM | POA: Insufficient documentation

## 2017-09-13 DIAGNOSIS — L738 Other specified follicular disorders: Secondary | ICD-10-CM | POA: Diagnosis not present

## 2017-09-13 DIAGNOSIS — L718 Other rosacea: Secondary | ICD-10-CM | POA: Diagnosis not present

## 2017-09-21 DIAGNOSIS — L650 Telogen effluvium: Secondary | ICD-10-CM | POA: Insufficient documentation

## 2017-09-21 DIAGNOSIS — L658 Other specified nonscarring hair loss: Secondary | ICD-10-CM | POA: Diagnosis not present

## 2017-10-10 ENCOUNTER — Ambulatory Visit: Payer: BLUE CROSS/BLUE SHIELD | Admitting: Gynecology

## 2017-10-10 ENCOUNTER — Encounter: Payer: Self-pay | Admitting: Gynecology

## 2017-10-10 VITALS — BP 118/76

## 2017-10-10 DIAGNOSIS — L709 Acne, unspecified: Secondary | ICD-10-CM

## 2017-10-10 NOTE — Patient Instructions (Signed)
Office will call you with the blood test results.  You will decide whether you want to start on low-dose oral contraceptives.

## 2017-10-10 NOTE — Progress Notes (Signed)
    Traci Best 09-01-1980 [id number]        37 y.o.  G0P0000 presents to discuss her acne.  She is being followed by dermatology struggling with issues of facial and neck acne.  She is always had this issue.  It seems to be getting worse for her.  They had recommended finasteride and spironolactone.  She had questions about starting these medications.  She wonders whether she should have her hormone levels checked.  She is having regular monthly menses with no intermenstrual bleeding or other issues.  Using condoms.  Had been on oral contraceptives a number of years ago but had a lot of issues with these.  No significant abnormal hair growth.  Past medical history,surgical history, problem list, medications, allergies, family history and social history were all reviewed and documented in the EPIC chart.  Directed ROS with pertinent positives and negatives documented in the history of present illness/assessment and plan.  Exam: Vitals:   10/10/17 0901  BP: 118/76   General appearance:  Normal HEENT shows normal skin with mild acne diffusely.  No abnormal hair growth noted on the head neck and upper chest area.  Assessment/Plan:  37 y.o. G0P0000 with acne followed by dermatology.  Recommendations are to start finasteride and spironolactone.  I discussed with her the normal physiology of ovarian androgens that are probably contributing to her acne.  Options for ovarian suppression to include reinitiation of low-dose oral contraceptives discussed.  Various formulations to include drospirenone containing as well as the LoLoestrin option for the lowest dose available also discussed.  We discussed checking androgen levels although whether this would play in any decision as far as management unclear.  No evidence of masculinization or more significant changes to suggest adrenal abnormalities.  We will go ahead and check her testosterone, total and free as well as SHBG.  She is not ready to commit to  low-dose oral contraceptives at this time but she may change her mind and I would suggest either Yaz or LoLoestrin.  We discussed risks of oral contraceptives to include thrombosis such as stroke heart attack DVT.  She is not being followed for any medical issues and never smoked.  Lastly she will continue to follow-up with her dermatologist in reference to management of her acne.   Greater than 50% of my time was spent in direct face to face counseling and coordination of care with the patient.    Dara Lordsimothy P Sun Kihn MD, 9:27 AM 10/10/2017

## 2017-10-14 LAB — TESTOS,TOTAL,FREE AND SHBG (FEMALE)
Free Testosterone: 1.5 pg/mL (ref 0.1–6.4)
SEX HORMONE BINDING: 112 nmol/L (ref 17–124)
Testosterone, Total, LC-MS-MS: 26 ng/dL (ref 2–45)

## 2017-10-16 DIAGNOSIS — I7300 Raynaud's syndrome without gangrene: Secondary | ICD-10-CM | POA: Insufficient documentation

## 2018-07-18 ENCOUNTER — Other Ambulatory Visit: Payer: Self-pay

## 2018-07-18 ENCOUNTER — Ambulatory Visit: Payer: BLUE CROSS/BLUE SHIELD | Admitting: Gynecology

## 2018-07-18 ENCOUNTER — Encounter: Payer: Self-pay | Admitting: Gynecology

## 2018-07-18 VITALS — BP 114/74 | Ht 69.0 in | Wt 146.0 lb

## 2018-07-18 DIAGNOSIS — Z13220 Encounter for screening for lipoid disorders: Secondary | ICD-10-CM

## 2018-07-18 DIAGNOSIS — Z1151 Encounter for screening for human papillomavirus (HPV): Secondary | ICD-10-CM | POA: Diagnosis not present

## 2018-07-18 DIAGNOSIS — Z01419 Encounter for gynecological examination (general) (routine) without abnormal findings: Secondary | ICD-10-CM

## 2018-07-18 NOTE — Progress Notes (Signed)
    Traci Best [birth year] [id number]        38 y.o.  G0P0000 for annual gynecologic exam.  Asked if I could look at a small bump on her vulva that is been there for years unchanged.  Asymptomatic to the patient other than she noticed it on exam.  Past medical history,surgical history, problem list, medications, allergies, family history and social history were all reviewed and documented as reviewed in the EPIC chart.  ROS:  Performed with pertinent positives and negatives included in the history, assessment and plan.   Additional significant findings : None   Exam: Kennon Portela assistant Vitals:   07/18/18 0902  BP: 114/74  Weight: 146 lb (66.2 kg)  Height: 5\' 9"  (1.753 m)   Body mass index is 21.56 kg/m.  General appearance:  Normal affect, orientation and appearance. Skin: Grossly normal HEENT: Without gross lesions.  No cervical or supraclavicular adenopathy. Thyroid normal.  Lungs:  Clear without wheezing, rales or rhonchi Cardiac: RR, without RMG Abdominal:  Soft, nontender, without masses, guarding, rebound, organomegaly or hernia Breasts:  Examined lying and sitting without masses, retractions, discharge or axillary adenopathy. Pelvic:  Ext, BUS, Vagina: Normal with small granular-like subcutaneous nodule at the fold of the left upper labia minora and labia majora.  No overlying skin changes.  Consistent with small sebaceous cyst  Cervix: Normal.  Pap smear/HPV  Uterus: Anteverted, normal size, shape and contour, midline and mobile nontender   Adnexa: Without masses or tenderness    Anus and perineum: Normal     Assessment/Plan:  38 y.o. G0P0000 female for annual gynecologic exam.  Regular menses  1. Contraception.  Not currently sexually active.  Has used condoms in the past and is satisfied with this.  We discussed in the past the failure risk as well as availability of Plan B.  She is not interested in doing anything different at this time. 2. STD screening  discussed, offered and declined. 3. Breast health.  SBE monthly reviewed.  Plan baseline mammography at age 38.  Breast exam normal today. 4. Pap smear/HPV 2015.  Pap smear/HPV today.  No history of abnormal Pap smears previously. 5. Health maintenance.  Baseline CBC, CMP and lipid profile ordered.  Follow-up 1 year, sooner as needed.   [name] MD, 9:20 AM 07/18/2018

## 2018-07-18 NOTE — Addendum Note (Signed)
Addended by: Dayna Barker on: 07/18/2018 09:54 AM   Modules accepted: Orders

## 2018-07-18 NOTE — Patient Instructions (Signed)
Follow-up in 1 year for annual exam, sooner if any issues. 

## 2018-07-19 LAB — COMPREHENSIVE METABOLIC PANEL
AG Ratio: 2.2 (calc) (ref 1.0–2.5)
ALT: 12 U/L (ref 6–29)
AST: 14 U/L (ref 10–30)
Albumin: 5 g/dL (ref 3.6–5.1)
Alkaline phosphatase (APISO): 51 U/L (ref 31–125)
BUN: 9 mg/dL (ref 7–25)
CO2: 27 mmol/L (ref 20–32)
Calcium: 9.5 mg/dL (ref 8.6–10.2)
Chloride: 104 mmol/L (ref 98–110)
Creat: 0.81 mg/dL (ref 0.50–1.10)
GLOBULIN: 2.3 g/dL (ref 1.9–3.7)
Glucose, Bld: 68 mg/dL (ref 65–99)
Potassium: 4 mmol/L (ref 3.5–5.3)
Sodium: 139 mmol/L (ref 135–146)
Total Bilirubin: 0.5 mg/dL (ref 0.2–1.2)
Total Protein: 7.3 g/dL (ref 6.1–8.1)

## 2018-07-19 LAB — LIPID PANEL
Cholesterol: 199 mg/dL (ref <200)
HDL: 79 mg/dL (ref >=50)
LDL Cholesterol (Calc): 107 mg/dL (calc) — ABNORMAL HIGH
Non-HDL Cholesterol (Calc): 120 mg/dL (calc) (ref <130)
Total CHOL/HDL Ratio: 2.5 (calc) (ref <5.0)
Triglycerides: 43 mg/dL (ref <150)

## 2018-07-19 LAB — CBC WITH DIFFERENTIAL/PLATELET
ABSOLUTE MONOCYTES: 410 {cells}/uL (ref 200–950)
BASOS PCT: 0.5 %
Basophils Absolute: 29 cells/uL (ref 0–200)
Eosinophils Absolute: 68 cells/uL (ref 15–500)
Eosinophils Relative: 1.2 %
HCT: 39.4 % (ref 35.0–45.0)
HEMOGLOBIN: 13.6 g/dL (ref 11.7–15.5)
LYMPHS ABS: 1858 {cells}/uL (ref 850–3900)
MCH: 31.1 pg (ref 27.0–33.0)
MCHC: 34.5 g/dL (ref 32.0–36.0)
MCV: 90.2 fL (ref 80.0–100.0)
MONOS PCT: 7.2 %
MPV: 10.4 fL (ref 7.5–12.5)
NEUTROS ABS: 3335 {cells}/uL (ref 1500–7800)
Neutrophils Relative %: 58.5 %
Platelets: 266 10*3/uL (ref 140–400)
RBC: 4.37 10*6/uL (ref 3.80–5.10)
RDW: 11.9 % (ref 11.0–15.0)
Total Lymphocyte: 32.6 %
WBC: 5.7 10*3/uL (ref 3.8–10.8)

## 2018-07-19 LAB — PAP IG AND HPV HIGH-RISK: HPV DNA High Risk: NOT DETECTED

## 2019-01-29 ENCOUNTER — Encounter: Payer: Self-pay | Admitting: Gynecology

## 2019-05-30 ENCOUNTER — Ambulatory Visit: Payer: Self-pay

## 2019-05-30 ENCOUNTER — Other Ambulatory Visit: Payer: Self-pay

## 2019-05-30 ENCOUNTER — Encounter: Payer: Self-pay | Admitting: Family Medicine

## 2019-05-30 ENCOUNTER — Ambulatory Visit: Payer: BC Managed Care – PPO | Admitting: Family Medicine

## 2019-05-30 DIAGNOSIS — R5383 Other fatigue: Secondary | ICD-10-CM | POA: Insufficient documentation

## 2019-05-30 DIAGNOSIS — M545 Low back pain, unspecified: Secondary | ICD-10-CM

## 2019-05-30 MED ORDER — BACLOFEN 10 MG PO TABS: 5.0000 mg | ORAL_TABLET | Freq: Every evening | ORAL | 3 refills | Status: AC | PRN

## 2019-05-30 MED ORDER — METHYLPREDNISOLONE 4 MG PO TBPK: ORAL_TABLET | ORAL | 0 refills | Status: AC

## 2019-05-30 MED ORDER — TRAMADOL HCL 50 MG PO TABS: 50.0000 mg | ORAL_TABLET | Freq: Every evening | ORAL | 0 refills | Status: AC | PRN

## 2019-05-30 NOTE — Progress Notes (Signed)
Office Visit Note   Patient: Traci Best           Date of Birth: 06-Jan-1981           MRN: [id number] Visit Date: 05/30/2019 Requested by: No referring provider defined for this encounter. PCP: Patient, No Pcp Per  Subjective: Chief Complaint  Patient presents with  . Lower Back - Pain    Been doing a 30-day yoga program that includes a lot of repetitive back flexion movements. Tightness in the back started 05/26/19. Pain is worse on the right side of back. No radiating pain down the legs.    HPI: She is here with low back pain.  She has been a runner and a yoga participant for many years but in the past 30 years she decided to do a 30-day yoga program that involves a lot more repetitive back extension movements.  Early on she noticed some pain but she continued to do the exercises.  Pain has gotten steadily worse and about 4 days ago it became severe to the point that she could hardly move.  Pain does not radiate down the legs, no bowel or bladder dysfunction, no fevers or chills and no weakness.  She has tried Aleve with minimal improvement.  She borrowed a lumbar corset brace which helps a little bit.  Pain goes away temporarily when she lies down, but as soon as she moves the pain comes back.  She has never had problems with her back before.  No family history of back problems.  She has a history of vitamin D deficiency for which she takes 1000 IU daily.  She has hypothyroidism which is well controlled and managed by an endocrinologist.  She works as a Clinical research associate.  She is contemplating moving to [place], [place].              ROS:   All other systems were reviewed and are negative.  Objective: Vital Signs: There were no vitals taken for this visit.  Physical Exam:  General:  Alert and oriented, in no acute distress. Pulm:  Breathing unlabored. Psy:  Normal mood, congruent affect. Skin: No rash Low back: No significant spinous process tenderness.  No scoliosis.  Mild tenderness  near the right SI joint which is in the area of her pain, but her pain is not completely reproducible by palpation.  No pain in the sciatic notch, negative straight leg raise.  Lower extremity strength and reflexes are normal.  She has very limited flexion and extension due to pain.  Imaging: X-rays lumbar spine with obliques: No sign of pars defect or stress fracture.  She has mild bilateral SI joint DJD and mild L5-S1 degenerative disc disease.  She has calcifications at the lateral aspect of the acetabulum in both hips suggesting chronic labrum tears.  No sign of neoplasm.    Assessment & Plan: 1.  Acute low back pain, possibly muscular versus SI joint dysfunction.  Cannot rule out stress reaction. -We will treat with medications, physical therapy.  If symptoms persist, MRI scan.     Procedures: No procedures performed  No notes on file     PMFS History: Patient Active Problem List   Diagnosis Date Noted  . Fatigue 05/30/2019  . Raynaud's phenomenon without gangrene 10/16/2017  . Telogen effluvium 09/21/2017  . ANA positive 07/16/2017  . Arthralgia 07/16/2017  . Female pattern hair loss 06/28/2016  . Leg pain, bilateral 12/08/2015  . Thyroid disease   . Autoimmune thyroiditis  07/08/2011   Past Medical History:  Diagnosis Date  . Acne   . Thyroid disease    Hashimotos  . Varicose veins     Family History  Problem Relation Age of Onset  . Cancer Mother        UTERINE CA  . Hyperlipidemia Mother   . Hyperlipidemia Father   . Arrhythmia Father   . Thyroid disease Maternal Aunt   . Thyroid disease Maternal Grandmother   . Hyperlipidemia Maternal Grandmother   . Dementia Maternal Grandmother   . Hyperlipidemia Maternal Grandfather   . Hyperlipidemia Paternal Grandmother   . Hyperlipidemia Paternal Grandfather     Past Surgical History:  Procedure Laterality Date  . WISDOM TOOTH EXTRACTION     Social History   Occupational History  . Not on file  Tobacco Use   . Smoking status: Never Smoker  . Smokeless tobacco: Never Used  Substance and Sexual Activity  . Alcohol use: Yes    Alcohol/week: 0.0 standard drinks    Comment: Social  . Drug use: No  . Sexual activity: Yes    Birth control/protection: Condom    Comment: Pt.declined sexual hx question

## 2019-06-03 ENCOUNTER — Other Ambulatory Visit: Payer: Self-pay

## 2019-06-03 ENCOUNTER — Encounter: Payer: Self-pay | Admitting: Obstetrics and Gynecology

## 2019-06-03 ENCOUNTER — Ambulatory Visit: Payer: BC Managed Care – PPO | Admitting: Obstetrics and Gynecology

## 2019-06-03 VITALS — BP 118/76

## 2019-06-03 DIAGNOSIS — R102 Pelvic and perineal pain: Secondary | ICD-10-CM | POA: Diagnosis not present

## 2019-06-03 DIAGNOSIS — N898 Other specified noninflammatory disorders of vagina: Secondary | ICD-10-CM

## 2019-06-03 DIAGNOSIS — N926 Irregular menstruation, unspecified: Secondary | ICD-10-CM | POA: Diagnosis not present

## 2019-06-03 LAB — NO CULTURE INDICATED

## 2019-06-03 LAB — URINALYSIS, COMPLETE W/RFL CULTURE
Bacteria, UA: NONE SEEN /HPF
Bilirubin Urine: NEGATIVE
Glucose, UA: NEGATIVE
Hgb urine dipstick: NEGATIVE
Hyaline Cast: NONE SEEN /LPF
Ketones, ur: NEGATIVE
Leukocyte Esterase: NEGATIVE
Nitrites, Initial: NEGATIVE
Protein, ur: NEGATIVE
RBC / HPF: NONE SEEN /HPF (ref 0–2)
Specific Gravity, Urine: 1.003 (ref 1.001–1.03)
WBC, UA: NONE SEEN /HPF (ref 0–5)
pH: 6.5 (ref 5.0–8.0)

## 2019-06-03 LAB — WET PREP FOR TRICH, YEAST, CLUE

## 2019-06-03 LAB — PREGNANCY, URINE: Preg Test, Ur: NEGATIVE

## 2019-06-03 NOTE — Patient Instructions (Signed)
Thanks for coming in to see Traci Best today! Please continue to track your periods.  If you experience any persistent and heavy vaginal bleeding especially if it is going on for days, please let Traci Best know, and we want to do some further investigation into the uterine tissue. If you experience any bothersome and ongoing changes to your periods and they are heavier than you would like them to be, we could consider discussing further some of the options to treat that. If your right-sided pelvic and abdominal pain does not improve with physical therapy, and the use of ice/heat in the area, please let me know, and we can plan to have you get a pelvic ultrasound to evaluate your right ovary. I hope you feel better soon.

## 2019-06-03 NOTE — Progress Notes (Signed)
Traci Best  11/28/1980 [id number]  HPI Traci Best is a 39 y.o. G0P0000 who presents today for right lower quadrant pain and bloating. She had recently had a problem with right sided back pain and was seen and put on steroids and a muscle relaxant.  She had onset of this back pain without any precipitating injury or trauma other than making a change to her typical yoga program.  Back is feeling mostly better but going to see physical therapy soon. Periods have been heavier and longer and closer together with cycles as short as 23 days. Patient's last menstrual period was 05/21/2019.  Her period typically had lasted 5 days and cycles were 26-28 days. This last period her flow stopped then had another day of heavier bleeding which is unusual for her.  She has had painful periods in the past, worse in the first 2 days worse in the first few days but no dehabilitating pain, which remains the case now.  Starting to have some occasional spotting in between periods.  She has been tracking her periods with an app.  She is not skipping any periods.  She does report that her mother had uterine cancer diagnosed in her early 50s and she is doing well.  She has not used hormonal birth control since age 10, when she quit she had been experiencing bad side effects including hair loss.  She reports normal daily bowel movements without straining.  The right lower quadrant pain is more constant now but had started as intermittent.  Pain is 'pinchy' not sharp.  It all started near or around the time when her back pain was bothering her.  The pain is worse with coughing.  She does have some dietary restrictions which she follows.  Overall her pelvis feels 'weak and heavy' to her.  No known prior history of ovarian cysts.   She does have Hashimoto thyroiditis and regularly takes Tirosint.  She states she is due for a TSH in March.  She denies hot flashes or night sweats.  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  Past medical history,surgical history, problem list, medications, allergies, family history and social history were all reviewed and documented as reviewed in the EPIC chart.  ROS:  Feeling well. No dyspnea or chest pain on exertion.  No abdominal pain, change in bowel habits, black or bloody stools.  No urinary tract symptoms. GYN ROS: as reviewed in HPI.  Physical Exam  BP 118/76   LMP 05/21/2019   General: Pleasant young female, no acute distress, alert and oriented  Abdomen: Soft, nondistended, nontender, tympanic to percussion.  No hernia appreciated. PELVIC EXAM: VULVA: normal appearing vulva with no masses, tenderness or lesions, VAGINA: normal appearing vagina with normal color, no lesions, scant yellow-white discharge present, no odor.  CERVIX: normal appearing cervix without discharge or lesions, UTERUS: uterus is normal size, shape, consistency and nontender, ADNEXA: normal adnexa in size, no masses appreciated.  Nontender on left adnexa.  Tenderness noted in the right adnexal region on the abdominal wall but there is no mass or fullness appreciated. WET MOUNT done - results: negative for pathogens, normal epithelial cells Urinalysis is negative.  Urine pregnancy test negative.  Assessment 39 year old G0 presenting with right lower quadrant pain, bloating, and menstrual irregularity  Plan 1.  Right lower quadrant pain and bloating.  Difficult to discern an exact etiology in  light of the recent episode of back pain.  The pelvic exam is not particularly concerning other than her pain in the region of the right adnexa, but there is no definitive evidence for an ovarian cyst or evidence of pelvic infection, and this is certainly not a clinical picture of ovarian torsion, or a pregnancy-related issue given the negative pregnancy test.  Vaginal wet prep and urinalysis were negative.  Given her history of  thyroid disease, I question if she is having a manifestation of thyroid imbalance, and she is planning to have a TSH checked soon.  I suspect most likely origin is either a musculoskeletal or GI cause, and I recommended that she work with physical therapist to address her back pain and a potentially related abdominal wall issue that she is experiencing.  If she is not finding any relief with exercises and follow-up there, we could consider having her come back for a pelvic ultrasound at that point.  She is agreeable and comfortable with this plan.  2.  Menstrual irregularity.  I discussed with menstrual hormonal changes as part of the female life cycle, sometimes there can be development of irregular menses and some abnormal uterine bleeding.  She does mention her mother has a history of uterine cancer.  She does herself have some risk factors including nulliparity in the family history, but otherwise she is of a fairly low overall risk with a normal BMI.  Thyroid disease can put one at risk for irregular menses.  TSH follow up as above.  Other possible etiologies include endometrial polyps or fibroids.  She had a normal Pap smear last year.  I recommended continuing to monitor her menstrual pattern and continue to track using the app she is using.  If her symptoms become bothersome and she has persistent irregular/heavier bleeding, we could consider a trial of hormonal contraception, such as hormonal IUD or another non-pill form if she would be amenable to any of those option.  With any persistent heavy and prolonged bleeding, we may consider endometrial biopsy for further evaluation. We can further discuss this at future visits if need be.    All questions were answered by the end of the encounter.   Ilda Foil MD 06/03/19

## 2019-06-04 NOTE — Addendum Note (Signed)
Addended by: Dayna Barker on: 06/04/2019 01:56 PM   Modules accepted: Orders

## 2019-06-14 ENCOUNTER — Other Ambulatory Visit: Payer: Self-pay | Admitting: *Deleted

## 2019-06-14 DIAGNOSIS — R1030 Lower abdominal pain, unspecified: Secondary | ICD-10-CM

## 2019-06-14 DIAGNOSIS — N926 Irregular menstruation, unspecified: Secondary | ICD-10-CM

## 2019-08-01 ENCOUNTER — Ambulatory Visit: Payer: BC Managed Care – PPO | Attending: Internal Medicine

## 2019-08-01 DIAGNOSIS — Z23 Encounter for immunization: Secondary | ICD-10-CM

## 2019-08-01 NOTE — Progress Notes (Signed)
   Covid-19 Vaccination Clinic  Name:  CAIDYN BLOSSOM    MRN: [id number] DOB: 02-06-1981  08/01/2019  Ms. Bluemel was observed post Covid-19 immunization for 15 minutes without incident. She was provided with Vaccine Information Sheet and instruction to access the V-Safe system.   Ms. Buffone was instructed to call 911 with any severe reactions post vaccine: Marland Kitchen Difficulty breathing  . Swelling of face and throat  . A fast heartbeat  . A bad rash all over body  . Dizziness and weakness   Immunizations Administered    Name Date Dose VIS Date Route   Pfizer COVID-19 Vaccine 08/01/2019  9:51 AM 0.3 mL 04/19/2019 Intramuscular   Manufacturer: [employer], [employer]   Lot: [id number]   NDC: [address]-1

## 2019-08-26 ENCOUNTER — Ambulatory Visit: Payer: BC Managed Care – PPO | Attending: Internal Medicine

## 2019-08-26 DIAGNOSIS — Z23 Encounter for immunization: Secondary | ICD-10-CM

## 2019-08-26 NOTE — Progress Notes (Signed)
   Covid-19 Vaccination Clinic  Name:  Traci Best    MRN: [id number] DOB: [birth year]  08/26/2019  [name] was observed post Covid-19 immunization for 15 minutes without incident. She was provided with Vaccine Information Sheet and instruction to access the V-Safe system.   [name] was instructed to call 911 with any severe reactions post vaccine: Marland Kitchen Difficulty breathing  . Swelling of face and throat  . A fast heartbeat  . A bad rash all over body  . Dizziness and weakness   Immunizations Administered    Name Date Dose VIS Date Route   Pfizer COVID-19 Vaccine 08/26/2019  9:32 AM 0.3 mL 07/03/2018 Intramuscular   Manufacturer: [employer], [employer]   Lot: [id number]   NDC: [address]-4

## 2020-09-10 ENCOUNTER — Telehealth: Payer: BC Managed Care – PPO | Admitting: Physician Assistant

## 2020-09-10 DIAGNOSIS — U071 COVID-19: Secondary | ICD-10-CM

## 2020-09-10 MED ORDER — AEROCHAMBER PLUS FLO-VU MEDIUM MISC: 1.0000 | Freq: Once | 0 refills | Status: AC

## 2020-09-10 MED ORDER — FLUTICASONE PROPIONATE 50 MCG/ACT NA SUSP: 2.0000 | Freq: Every day | NASAL | 0 refills | Status: AC

## 2020-09-10 MED ORDER — BENZONATATE 100 MG PO CAPS: 100.0000 mg | ORAL_CAPSULE | Freq: Three times a day (TID) | ORAL | 0 refills | Status: AC | PRN

## 2020-09-10 MED ORDER — NAPROXEN 500 MG PO TABS: 500.0000 mg | ORAL_TABLET | Freq: Two times a day (BID) | ORAL | 0 refills | Status: AC

## 2020-09-10 MED ORDER — ALBUTEROL SULFATE HFA 108 (90 BASE) MCG/ACT IN AERS: 2.0000 | INHALATION_SPRAY | RESPIRATORY_TRACT | 0 refills | Status: AC | PRN

## 2020-09-10 NOTE — Progress Notes (Signed)
E-Visit for Positive Covid Test Result We are sorry you are not feeling well. We are here to help!  You have tested positive for COVID-19, meaning that you were infected with the novel coronavirus and could give the virus to others.  It is vitally important that you stay home so you do not spread it to others.      Please continue isolation at home, for at least 10 days since the start of your symptoms and until you have had 24 hours with no fever (without taking a fever reducer) and with improving of symptoms.  If you have no symptoms but tested positive (or all symptoms resolve after 5 days and you have no fever) you can leave your house but continue to wear a mask around others for an additional 5 days. If you have a fever,continue to stay home until you have had 24 hours of no fever. Most cases improve 5-10 days from onset but we have seen a small number of patients who have gotten worse after the 10 days.  Please be sure to watch for worsening symptoms and remain taking the proper precautions.   Go to the nearest hospital ED for assessment if fever/cough/breathlessness are severe or illness seems like a threat to life.    The following symptoms may appear 2-14 days after exposure: . Fever . Cough . Shortness of breath or difficulty breathing . Chills . Repeated shaking with chills . Muscle pain . Headache . Sore throat . New loss of taste or smell . Fatigue . Congestion or runny nose . Nausea or vomiting . Diarrhea  You have been enrolled in [facility] Monitoring for COVID-19. Daily you will receive a questionnaire within the MyChart website. Our COVID-19 response team will be monitoring your responses daily.  You can use medication such as prescription cough medication called Tessalon Perles 100 mg. You may take 1-2 capsules every 8 hours as needed for cough,  prescription inhaler called Albuterol MDI 90 mcg /actuation 2 puffs every 4 hours as needed for shortness of breath,  wheezing, cough, prescription anti-inflammatory called Naprosyn 500 mg. Take twice daily as needed for fever or body aches for 2 weeks and prescription for Fluticasone nasal spray 2 sprays in each nostril one time per day  You may also take acetaminophen (Tylenol) as needed for fever.  HOME CARE: . Only take medications as instructed by your medical team. . Drink plenty of fluids and get plenty of rest. . A steam or ultrasonic humidifier can help if you have congestion.   GET HELP RIGHT AWAY IF YOU HAVE EMERGENCY WARNING SIGNS.  Call 911 or proceed to your closest emergency facility if: . You develop worsening high fever. . Trouble breathing . Bluish lips or face . Persistent pain or pressure in the chest . New confusion . Inability to wake or stay awake . You cough up blood. . Your symptoms become more severe . Inability to hold down food or fluids  This list is not all possible symptoms. Contact your medical provider for any symptoms that are severe or concerning to you.    Your e-visit answers were reviewed by a board certified advanced clinical practitioner to complete your personal care plan.  Depending on the condition, your plan could have included both over the counter or prescription medications.  If there is a problem please reply once you have received a response from your provider.  Your safety is important to [place].  If you have drug allergies  check your prescription carefully.    You can use MyChart to ask questions about today's visit, request a non-urgent call back, or ask for a work or school excuse for 24 hours related to this e-Visit. If it has been greater than 24 hours you will need to follow up with your provider, or enter a new e-Visit to address those concerns. You will get an e-mail in the next two days asking about your experience.  I hope that your e-visit has been valuable and will speed your recovery. Thank you for using e-visits.     Greater than 5  minutes, yet less than 10 minutes of time have been spent researching, coordinating, and implementing care for this patient today

## 2020-10-20 DIAGNOSIS — E063 Autoimmune thyroiditis: Secondary | ICD-10-CM | POA: Diagnosis not present

## 2020-10-20 DIAGNOSIS — D2262 Melanocytic nevi of left upper limb, including shoulder: Secondary | ICD-10-CM | POA: Diagnosis not present

## 2020-10-20 DIAGNOSIS — D2272 Melanocytic nevi of left lower limb, including hip: Secondary | ICD-10-CM | POA: Diagnosis not present

## 2020-10-20 DIAGNOSIS — Z85828 Personal history of other malignant neoplasm of skin: Secondary | ICD-10-CM | POA: Diagnosis not present

## 2020-10-20 DIAGNOSIS — Z85820 Personal history of malignant melanoma of skin: Secondary | ICD-10-CM | POA: Diagnosis not present

## 2020-10-22 DIAGNOSIS — E063 Autoimmune thyroiditis: Secondary | ICD-10-CM | POA: Diagnosis not present

## 2020-10-22 DIAGNOSIS — L658 Other specified nonscarring hair loss: Secondary | ICD-10-CM | POA: Diagnosis not present

## 2020-12-02 DIAGNOSIS — H10413 Chronic giant papillary conjunctivitis, bilateral: Secondary | ICD-10-CM | POA: Diagnosis not present

## 2020-12-02 DIAGNOSIS — H5213 Myopia, bilateral: Secondary | ICD-10-CM | POA: Diagnosis not present

## 2020-12-02 DIAGNOSIS — H52203 Unspecified astigmatism, bilateral: Secondary | ICD-10-CM | POA: Diagnosis not present

## 2021-02-04 DIAGNOSIS — E063 Autoimmune thyroiditis: Secondary | ICD-10-CM | POA: Diagnosis not present

## 2021-02-05 DIAGNOSIS — E063 Autoimmune thyroiditis: Secondary | ICD-10-CM | POA: Diagnosis not present

## 2021-02-05 DIAGNOSIS — E038 Other specified hypothyroidism: Secondary | ICD-10-CM | POA: Diagnosis not present

## 2021-04-21 DIAGNOSIS — Z85828 Personal history of other malignant neoplasm of skin: Secondary | ICD-10-CM | POA: Diagnosis not present

## 2021-04-21 DIAGNOSIS — D2262 Melanocytic nevi of left upper limb, including shoulder: Secondary | ICD-10-CM | POA: Diagnosis not present

## 2021-04-21 DIAGNOSIS — Z85820 Personal history of malignant melanoma of skin: Secondary | ICD-10-CM | POA: Diagnosis not present

## 2021-04-21 DIAGNOSIS — D1723 Benign lipomatous neoplasm of skin and subcutaneous tissue of right leg: Secondary | ICD-10-CM | POA: Diagnosis not present

## 2021-04-22 DIAGNOSIS — Z01419 Encounter for gynecological examination (general) (routine) without abnormal findings: Secondary | ICD-10-CM | POA: Diagnosis not present

## 2021-05-12 ENCOUNTER — Other Ambulatory Visit: Payer: Self-pay | Admitting: Obstetrics and Gynecology

## 2021-05-12 DIAGNOSIS — Z1231 Encounter for screening mammogram for malignant neoplasm of breast: Secondary | ICD-10-CM

## 2021-06-21 DIAGNOSIS — Z1231 Encounter for screening mammogram for malignant neoplasm of breast: Secondary | ICD-10-CM

## 2021-07-21 ENCOUNTER — Ambulatory Visit
Admission: RE | Admit: 2021-07-21 | Discharge: 2021-07-21 | Disposition: A | Discharge status: Home / Self Care | Payer: BC Managed Care – PPO | Source: Ambulatory Visit | Attending: Obstetrics and Gynecology | Admitting: Obstetrics and Gynecology

## 2021-07-21 DIAGNOSIS — Z1231 Encounter for screening mammogram for malignant neoplasm of breast: Secondary | ICD-10-CM | POA: Diagnosis not present

## 2021-07-26 DIAGNOSIS — E038 Other specified hypothyroidism: Secondary | ICD-10-CM | POA: Diagnosis not present

## 2021-07-26 DIAGNOSIS — E063 Autoimmune thyroiditis: Secondary | ICD-10-CM | POA: Diagnosis not present

## 2021-10-19 DIAGNOSIS — D2262 Melanocytic nevi of left upper limb, including shoulder: Secondary | ICD-10-CM | POA: Diagnosis not present

## 2021-10-19 DIAGNOSIS — D2261 Melanocytic nevi of right upper limb, including shoulder: Secondary | ICD-10-CM | POA: Diagnosis not present

## 2021-10-19 DIAGNOSIS — L821 Other seborrheic keratosis: Secondary | ICD-10-CM | POA: Diagnosis not present

## 2021-10-19 DIAGNOSIS — L820 Inflamed seborrheic keratosis: Secondary | ICD-10-CM | POA: Diagnosis not present

## 2021-10-19 DIAGNOSIS — Z85828 Personal history of other malignant neoplasm of skin: Secondary | ICD-10-CM | POA: Diagnosis not present

## 2021-10-27 DIAGNOSIS — R1013 Epigastric pain: Secondary | ICD-10-CM | POA: Diagnosis not present

## 2022-01-21 DIAGNOSIS — F411 Generalized anxiety disorder: Secondary | ICD-10-CM | POA: Diagnosis not present

## 2022-01-21 DIAGNOSIS — F430 Acute stress reaction: Secondary | ICD-10-CM | POA: Diagnosis not present

## 2022-01-21 DIAGNOSIS — E038 Other specified hypothyroidism: Secondary | ICD-10-CM | POA: Diagnosis not present

## 2022-01-21 DIAGNOSIS — E063 Autoimmune thyroiditis: Secondary | ICD-10-CM | POA: Diagnosis not present

## 2022-03-08 DIAGNOSIS — E063 Autoimmune thyroiditis: Secondary | ICD-10-CM | POA: Diagnosis not present

## 2022-04-20 DIAGNOSIS — D2261 Melanocytic nevi of right upper limb, including shoulder: Secondary | ICD-10-CM | POA: Diagnosis not present

## 2022-04-20 DIAGNOSIS — L821 Other seborrheic keratosis: Secondary | ICD-10-CM | POA: Diagnosis not present

## 2022-04-20 DIAGNOSIS — D2262 Melanocytic nevi of left upper limb, including shoulder: Secondary | ICD-10-CM | POA: Diagnosis not present

## 2022-04-20 DIAGNOSIS — Z85828 Personal history of other malignant neoplasm of skin: Secondary | ICD-10-CM | POA: Diagnosis not present

## 2022-05-24 DIAGNOSIS — Z01419 Encounter for gynecological examination (general) (routine) without abnormal findings: Secondary | ICD-10-CM | POA: Diagnosis not present

## 2022-05-24 DIAGNOSIS — F419 Anxiety disorder, unspecified: Secondary | ICD-10-CM | POA: Diagnosis not present

## 2022-06-15 ENCOUNTER — Other Ambulatory Visit: Payer: Self-pay | Admitting: Obstetrics and Gynecology

## 2022-06-15 DIAGNOSIS — Z1231 Encounter for screening mammogram for malignant neoplasm of breast: Secondary | ICD-10-CM

## 2022-06-21 DIAGNOSIS — R350 Frequency of micturition: Secondary | ICD-10-CM | POA: Diagnosis not present

## 2022-07-04 DIAGNOSIS — E038 Other specified hypothyroidism: Secondary | ICD-10-CM | POA: Diagnosis not present

## 2022-07-04 DIAGNOSIS — E063 Autoimmune thyroiditis: Secondary | ICD-10-CM | POA: Diagnosis not present

## 2022-07-05 DIAGNOSIS — R140 Abdominal distension (gaseous): Secondary | ICD-10-CM | POA: Diagnosis not present

## 2022-07-05 DIAGNOSIS — E063 Autoimmune thyroiditis: Secondary | ICD-10-CM | POA: Diagnosis not present

## 2022-07-05 DIAGNOSIS — R102 Pelvic and perineal pain: Secondary | ICD-10-CM | POA: Diagnosis not present

## 2022-07-05 DIAGNOSIS — E038 Other specified hypothyroidism: Secondary | ICD-10-CM | POA: Diagnosis not present

## 2022-07-05 DIAGNOSIS — N760 Acute vaginitis: Secondary | ICD-10-CM | POA: Diagnosis not present

## 2022-07-05 DIAGNOSIS — Z6822 Body mass index (BMI) 22.0-22.9, adult: Secondary | ICD-10-CM | POA: Diagnosis not present

## 2022-07-20 ENCOUNTER — Ambulatory Visit (INDEPENDENT_AMBULATORY_CARE_PROVIDER_SITE_OTHER): Payer: BC Managed Care – PPO

## 2022-07-20 ENCOUNTER — Encounter: Payer: Self-pay | Admitting: Physician Assistant

## 2022-07-20 ENCOUNTER — Ambulatory Visit: Payer: BC Managed Care – PPO | Admitting: Physician Assistant

## 2022-07-20 DIAGNOSIS — M25562 Pain in left knee: Secondary | ICD-10-CM

## 2022-07-20 NOTE — Progress Notes (Signed)
Office Visit Note   Patient: Traci Best           Date of Birth: [birth year]           MRN: [id number] Visit Date: 07/20/2022              Requested by: Allyn Kenner, [place] [address] [place],  [place] [address] PCP: Allyn Kenner, DO   Assessment & Plan: Visit Diagnoses:  1. Acute pain of left knee     Plan: [name] is a pleasant 42 year old woman who woke up Monday night with severe lateral pain in her left knee.  She said she had difficulty bearing weight and even touching it lightly.  She denies any swelling or fluid.  She denies any different activities though she is extremely active taking multiple yoga classes every week as well as walking her dog.  She says that today the pain is significantly eased up and she is not really having much pain at its worst it was an 8 out of 10.  She has been consistently icing.  No previous injury of the knee.  Exam was fairly benign she does have a little tenderness over the lateral joint line could have been contusion or small tear to the lateral meniscus given her hypermobility.  Less likely patellar subluxation as this does not seem to be where she is tender to me.  I told her to use some Voltaren gel and watch this the next couple weeks if she has return of symptoms she would call me and given her mechanical findings would consider an MRI.  We also discussed trying Voltaren gel.  Have asked that she avoid extremes of flexion and extension for a couple weeks  Follow-Up Instructions: No follow-ups on file.   Orders:  Orders Placed This Encounter  Procedures   XR KNEE 3 VIEW LEFT   No orders of the defined types were placed in this encounter.     Procedures: No procedures performed   Clinical Data: No additional findings.   Subjective: Chief Complaint  Patient presents with   Left Knee - Pain    HPI pleasant 42 year old woman with a history of acute onset of left knee pain when she got out of bed on  Monday night.  Denies any previous injury though she is quite active with multiple activities including yoga low impact cardio and walking her dog several miles  Pain Assessment  Average Pain: 8 Current Pain: 4 Aggravating Factors: Currently not bothering her very much Alleviating Factors: Icing Review of Systems  All other systems reviewed and are negative.    Objective: Vital Signs: There were no vitals taken for this visit.  Physical Exam Constitutional:      Appearance: Normal appearance.  Pulmonary:     Effort: Pulmonary effort is normal.  Skin:    General: Skin is warm and dry.  Neurological:     General: No focal deficit present.     Mental Status: She is alert.  Psychiatric:        Mood and Affect: Mood normal.     Ortho Exam Left knee no warmth effusion or redness.  She has good strength with dorsiflexion plantarflexion of her ankles extension flexion of her knees.  Good endpoint on anterior draw good varus valgus stability she does have some tenderness to palpation over the posterior lateral joint line.  She has quite a bit of flexibility and has some slight hyperextension.  No tenderness medially  no apprehension with manipulation of her patellofemoral joint she is neurovascular intact compartments are soft Specialty Comments:  No specialty comments available.  Imaging: XR KNEE 3 VIEW LEFT  Result Date: 07/20/2022 Radiographs of her left knee were reviewed today.  Well-preserved joint spacing.  No evidence of any acute injuries.    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 hair loss 06/28/2016   Leg pain, bilateral 12/08/2015   Thyroid disease    Autoimmune thyroiditis 07/08/2011   Past Medical History:  Diagnosis Date   Acne     Thyroid disease    Hashimotos   Varicose veins     Family History  Problem Relation Age of Onset   Cancer Mother        UTERINE CA   Hyperlipidemia Mother    Hyperlipidemia Father    Arrhythmia Father    Thyroid disease Maternal Aunt    Thyroid disease Maternal Grandmother    Hyperlipidemia Maternal Grandmother    Dementia Maternal Grandmother    Hyperlipidemia Maternal Grandfather    Hyperlipidemia Paternal Grandmother    Hyperlipidemia Paternal Grandfather    Breast cancer Neg Hx     Past Surgical History:  Procedure Laterality Date   WISDOM TOOTH EXTRACTION     Social History   Occupational History   Not on file  Tobacco Use   Smoking status: Never   Smokeless tobacco: Never  Vaping Use   Vaping Use: Never used  Substance and Sexual Activity   Alcohol use: Yes    Alcohol/week: 0.0 standard drinks of alcohol    Comment: Social   Drug use: No   Sexual activity: Yes    Birth control/protection: Condom    Comment: Pt.declined sexual hx question

## 2022-07-28 DIAGNOSIS — N979 Female infertility, unspecified: Secondary | ICD-10-CM | POA: Diagnosis not present

## 2022-07-28 DIAGNOSIS — E289 Ovarian dysfunction, unspecified: Secondary | ICD-10-CM | POA: Diagnosis not present

## 2022-10-19 ENCOUNTER — Ambulatory Visit
Admission: RE | Admit: 2022-10-19 | Discharge: 2022-10-19 | Disposition: A | Discharge status: Home / Self Care | Payer: BC Managed Care – PPO | Source: Ambulatory Visit | Attending: Obstetrics and Gynecology | Admitting: Obstetrics and Gynecology

## 2022-10-19 DIAGNOSIS — Z1231 Encounter for screening mammogram for malignant neoplasm of breast: Secondary | ICD-10-CM | POA: Diagnosis not present

## 2022-12-02 DIAGNOSIS — H52203 Unspecified astigmatism, bilateral: Secondary | ICD-10-CM | POA: Diagnosis not present

## 2022-12-02 DIAGNOSIS — H5213 Myopia, bilateral: Secondary | ICD-10-CM | POA: Diagnosis not present

## 2022-12-02 DIAGNOSIS — H31003 Unspecified chorioretinal scars, bilateral: Secondary | ICD-10-CM | POA: Diagnosis not present

## 2022-12-22 DIAGNOSIS — G2581 Restless legs syndrome: Secondary | ICD-10-CM | POA: Diagnosis not present

## 2022-12-22 DIAGNOSIS — M79662 Pain in left lower leg: Secondary | ICD-10-CM | POA: Diagnosis not present

## 2022-12-22 DIAGNOSIS — I872 Venous insufficiency (chronic) (peripheral): Secondary | ICD-10-CM | POA: Diagnosis not present

## 2022-12-22 DIAGNOSIS — I83893 Varicose veins of bilateral lower extremities with other complications: Secondary | ICD-10-CM | POA: Diagnosis not present

## 2022-12-22 DIAGNOSIS — M79661 Pain in right lower leg: Secondary | ICD-10-CM | POA: Diagnosis not present

## 2023-01-18 DIAGNOSIS — I83893 Varicose veins of bilateral lower extremities with other complications: Secondary | ICD-10-CM | POA: Diagnosis not present

## 2023-01-23 DIAGNOSIS — R600 Localized edema: Secondary | ICD-10-CM | POA: Diagnosis not present

## 2023-01-31 DIAGNOSIS — E038 Other specified hypothyroidism: Secondary | ICD-10-CM | POA: Diagnosis not present

## 2023-01-31 DIAGNOSIS — E063 Autoimmune thyroiditis: Secondary | ICD-10-CM | POA: Diagnosis not present

## 2023-02-03 DIAGNOSIS — E039 Hypothyroidism, unspecified: Secondary | ICD-10-CM | POA: Diagnosis not present

## 2023-02-09 DIAGNOSIS — Z1331 Encounter for screening for depression: Secondary | ICD-10-CM | POA: Diagnosis not present

## 2023-02-09 DIAGNOSIS — Z0000 Encounter for general adult medical examination without abnormal findings: Secondary | ICD-10-CM | POA: Diagnosis not present

## 2023-02-09 DIAGNOSIS — Z1339 Encounter for screening examination for other mental health and behavioral disorders: Secondary | ICD-10-CM | POA: Diagnosis not present

## 2023-02-09 DIAGNOSIS — E063 Autoimmune thyroiditis: Secondary | ICD-10-CM | POA: Diagnosis not present

## 2023-02-09 DIAGNOSIS — Z0189 Encounter for other specified special examinations: Secondary | ICD-10-CM | POA: Diagnosis not present

## 2023-02-09 DIAGNOSIS — Z1389 Encounter for screening for other disorder: Secondary | ICD-10-CM | POA: Diagnosis not present

## 2023-04-26 DIAGNOSIS — Z85828 Personal history of other malignant neoplasm of skin: Secondary | ICD-10-CM | POA: Diagnosis not present

## 2023-04-26 DIAGNOSIS — D225 Melanocytic nevi of trunk: Secondary | ICD-10-CM | POA: Diagnosis not present

## 2023-04-26 DIAGNOSIS — D2272 Melanocytic nevi of left lower limb, including hip: Secondary | ICD-10-CM | POA: Diagnosis not present

## 2023-04-26 DIAGNOSIS — D2262 Melanocytic nevi of left upper limb, including shoulder: Secondary | ICD-10-CM | POA: Diagnosis not present

## 2023-04-28 DIAGNOSIS — R600 Localized edema: Secondary | ICD-10-CM | POA: Diagnosis not present

## 2023-11-13 IMAGING — MG MM DIGITAL SCREENING BILAT W/ TOMO AND CAD
8 series · 9 of 24 positions shown · non-contrast
Comparison: Previous exam(s).

CLINICAL DATA: Screening.

EXAM:
DIGITAL SCREENING BILATERAL MAMMOGRAM WITH TOMOSYNTHESIS AND CAD
TECHNIQUE: Bilateral screening digital craniocaudal and mediolateral oblique
mammograms were obtained. Bilateral screening digital breast
tomosynthesis was performed. The images were evaluated with
computer-aided detection.

[R CC synth-2D]
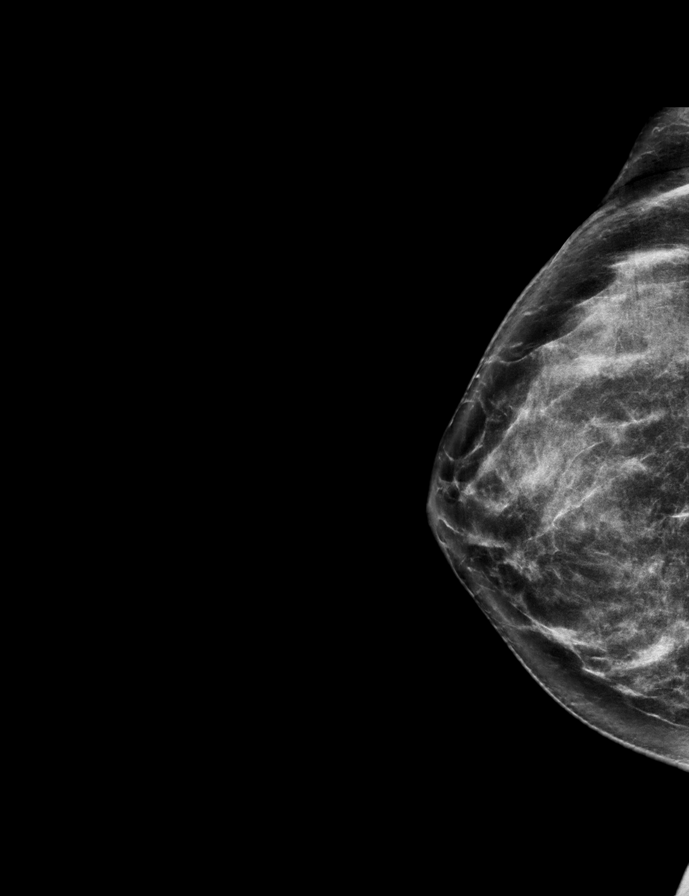

[L MLO synth-2D]
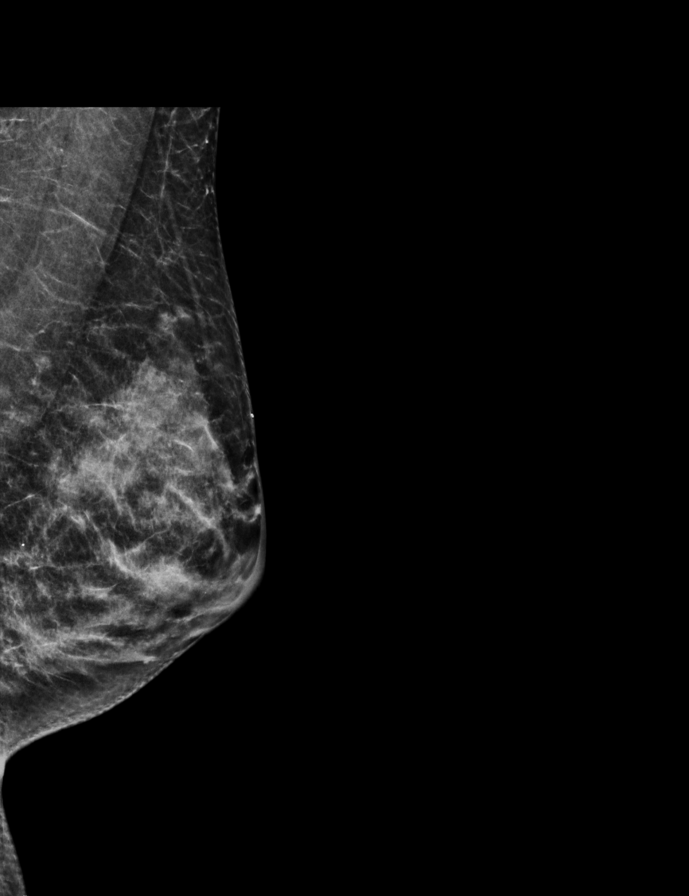

[R MLO synth-2D]
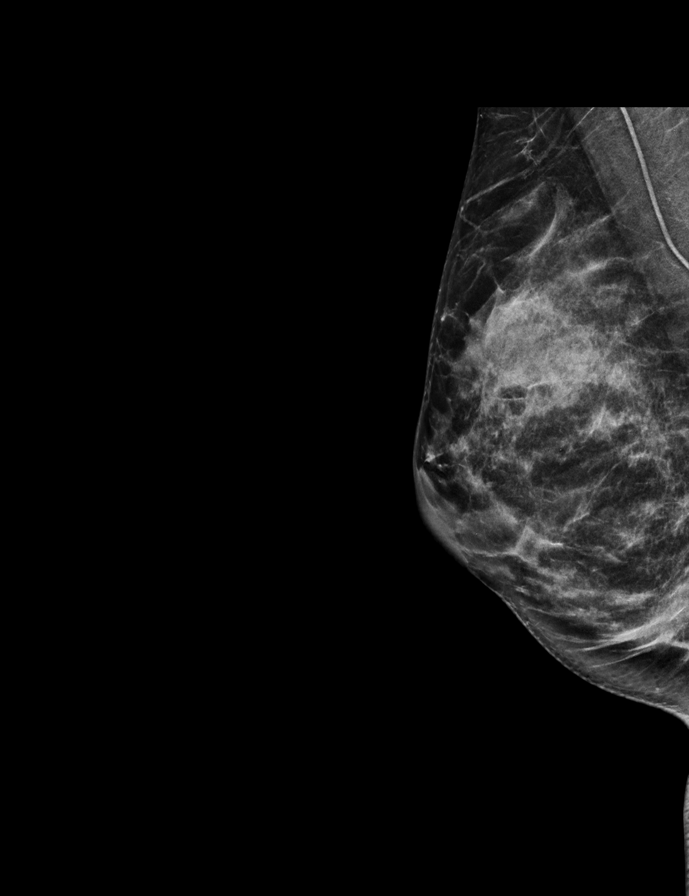

[L CC synth-2D]
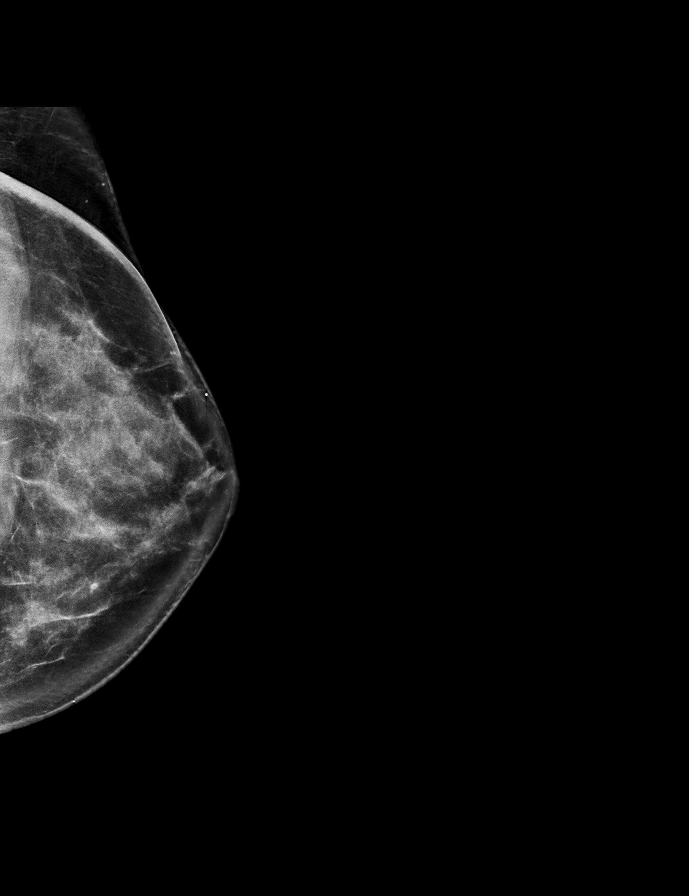

[R CC tomo · 2 of 66 frames shown]
[frame 22/66]
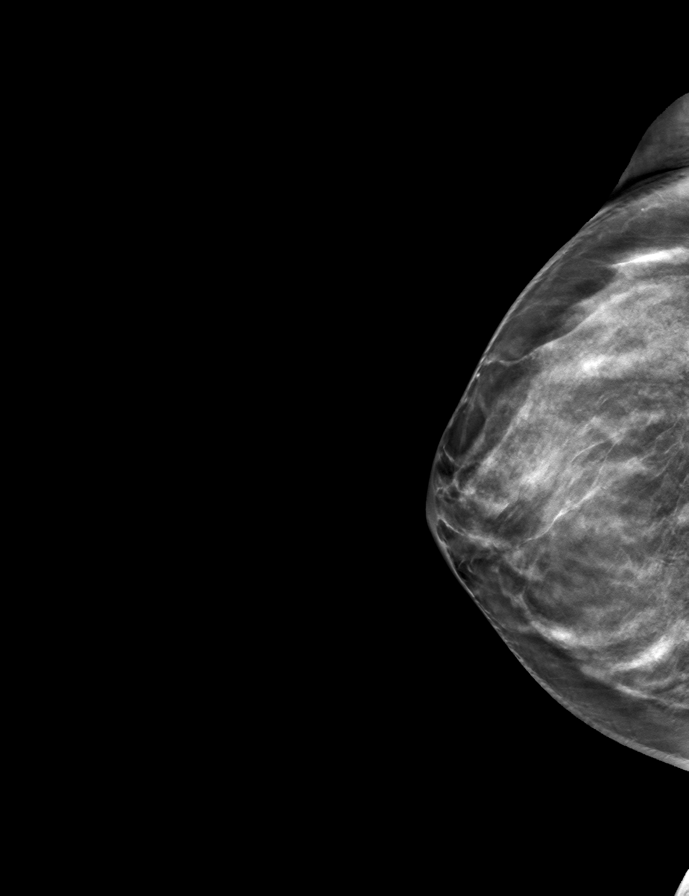
[frame 33/66]
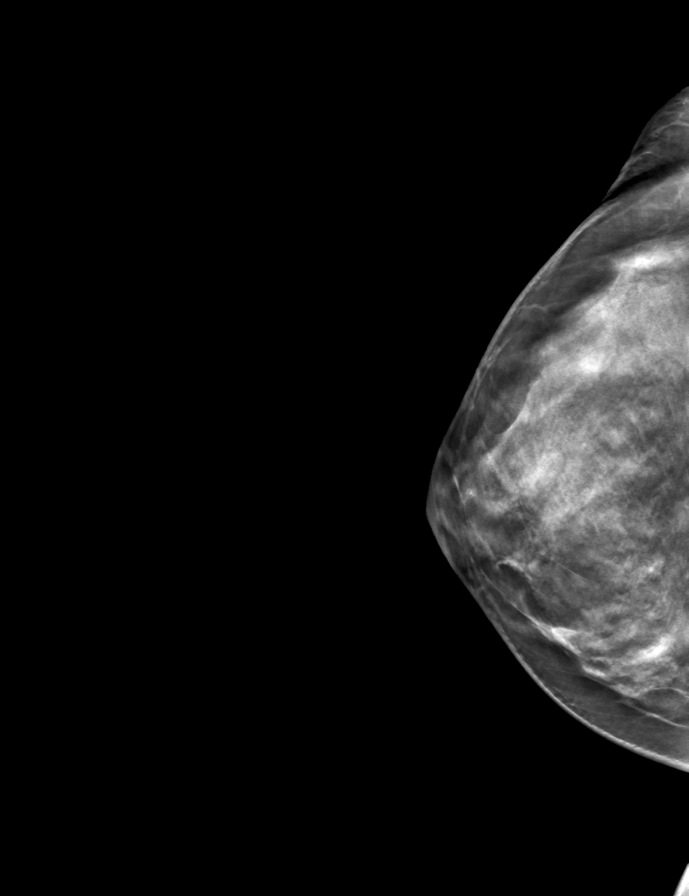

[R MLO tomo · tomo slice 29/56.0]
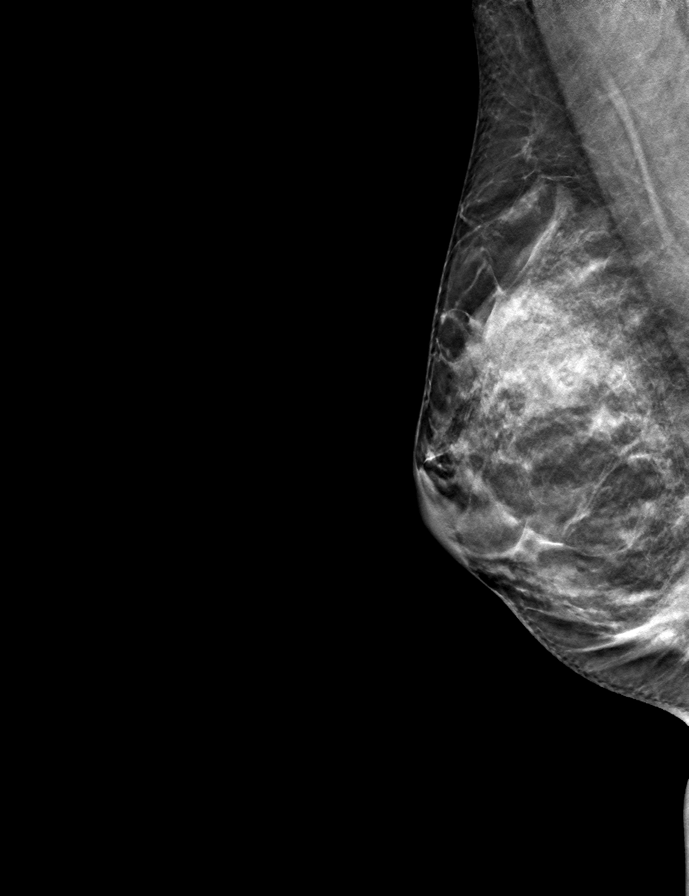

[L CC tomo · tomo slice 38/75.0]
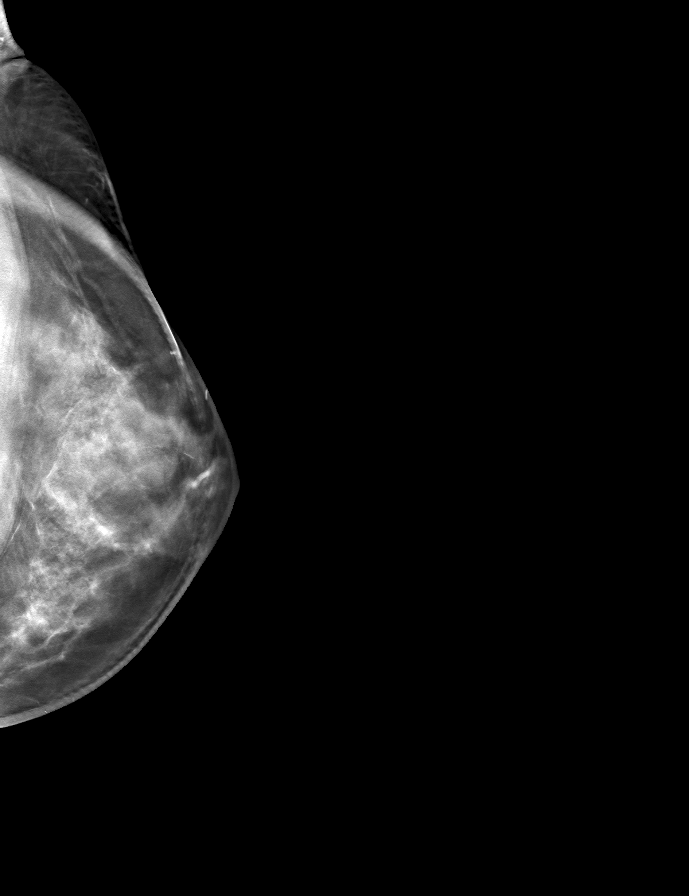

[L MLO tomo · tomo slice 28/55.0]
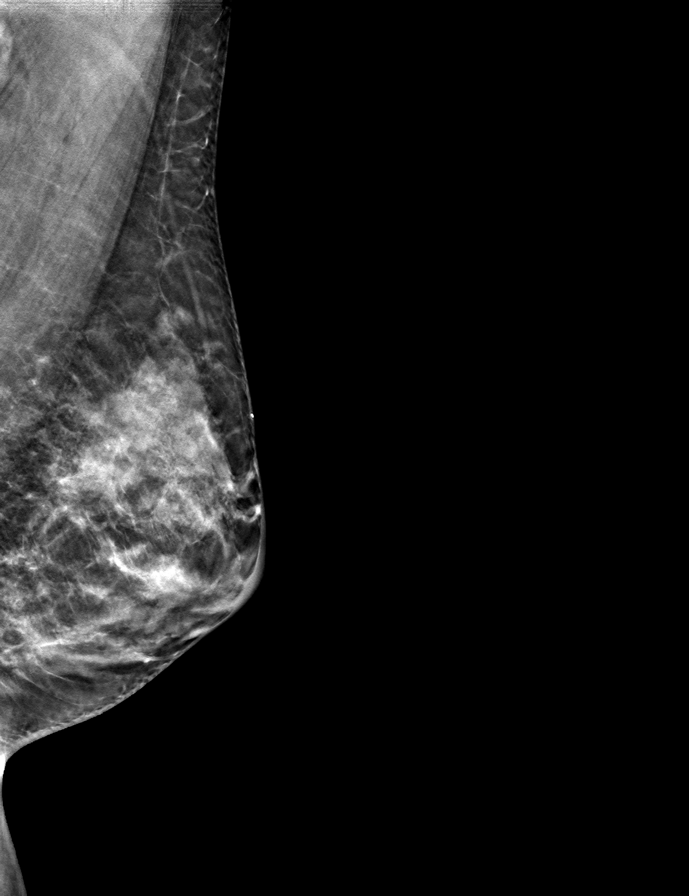

[9 of 24 positions shown; findings below may reference images not displayed]

ACR Breast Density Category d: The breast tissue is extremely dense,
which lowers the sensitivity of mammography
FINDINGS: There are no findings suspicious for malignancy.
IMPRESSION: No mammographic evidence of malignancy. A result letter of this
screening mammogram will be mailed directly to the patient.

RECOMMENDATION:
Screening mammogram in one year. (Code:[id number])

BI-RADS CATEGORY  1: Negative.

## 2024-01-29 DIAGNOSIS — M545 Low back pain, unspecified: Secondary | ICD-10-CM | POA: Diagnosis not present

## 2024-03-06 DIAGNOSIS — D0372 Melanoma in situ of left lower limb, including hip: Secondary | ICD-10-CM | POA: Diagnosis not present

## 2024-03-06 DIAGNOSIS — Z85828 Personal history of other malignant neoplasm of skin: Secondary | ICD-10-CM | POA: Diagnosis not present

## 2024-03-06 DIAGNOSIS — L649 Androgenic alopecia, unspecified: Secondary | ICD-10-CM | POA: Diagnosis not present

## 2024-03-11 DIAGNOSIS — Z01419 Encounter for gynecological examination (general) (routine) without abnormal findings: Secondary | ICD-10-CM | POA: Diagnosis not present

## 2024-03-14 DIAGNOSIS — L988 Other specified disorders of the skin and subcutaneous tissue: Secondary | ICD-10-CM | POA: Diagnosis not present

## 2024-03-14 DIAGNOSIS — D0372 Melanoma in situ of left lower limb, including hip: Secondary | ICD-10-CM | POA: Diagnosis not present

## 2024-03-21 DIAGNOSIS — E063 Autoimmune thyroiditis: Secondary | ICD-10-CM | POA: Diagnosis not present

## 2024-03-28 DIAGNOSIS — D485 Neoplasm of uncertain behavior of skin: Secondary | ICD-10-CM | POA: Diagnosis not present

## 2024-03-28 DIAGNOSIS — D2262 Melanocytic nevi of left upper limb, including shoulder: Secondary | ICD-10-CM | POA: Diagnosis not present

## 2024-04-17 DIAGNOSIS — H52203 Unspecified astigmatism, bilateral: Secondary | ICD-10-CM | POA: Diagnosis not present

## 2024-04-17 DIAGNOSIS — H31003 Unspecified chorioretinal scars, bilateral: Secondary | ICD-10-CM | POA: Diagnosis not present

## 2024-04-17 DIAGNOSIS — H5213 Myopia, bilateral: Secondary | ICD-10-CM | POA: Diagnosis not present

## 2024-04-26 ENCOUNTER — Other Ambulatory Visit: Payer: Self-pay | Admitting: Obstetrics and Gynecology

## 2024-04-26 DIAGNOSIS — Z1231 Encounter for screening mammogram for malignant neoplasm of breast: Secondary | ICD-10-CM

## 2024-05-20 ENCOUNTER — Ambulatory Visit
Admission: RE | Admit: 2024-05-20 | Discharge: 2024-05-20 | Disposition: A | Source: Ambulatory Visit | Attending: Obstetrics and Gynecology | Admitting: Obstetrics and Gynecology

## 2024-05-20 DIAGNOSIS — Z1231 Encounter for screening mammogram for malignant neoplasm of breast: Secondary | ICD-10-CM
